# Patient Record
Sex: Female | Born: 1955
Health system: Southern US, Community
[De-identification: ages and names within clinical notes are randomized; demographics above are authoritative.]

## PROBLEM LIST (undated history)

## (undated) DIAGNOSIS — T7840XA Allergy, unspecified, initial encounter: Secondary | ICD-10-CM

## (undated) DIAGNOSIS — J449 Chronic obstructive pulmonary disease, unspecified: Secondary | ICD-10-CM

## (undated) DIAGNOSIS — I10 Essential (primary) hypertension: Secondary | ICD-10-CM

## (undated) DIAGNOSIS — E785 Hyperlipidemia, unspecified: Secondary | ICD-10-CM

## (undated) DIAGNOSIS — Z8669 Personal history of other diseases of the nervous system and sense organs: Secondary | ICD-10-CM

## (undated) DIAGNOSIS — M1990 Unspecified osteoarthritis, unspecified site: Secondary | ICD-10-CM

## (undated) DIAGNOSIS — E119 Type 2 diabetes mellitus without complications: Secondary | ICD-10-CM

## (undated) DIAGNOSIS — F32A Depression, unspecified: Secondary | ICD-10-CM

## (undated) HISTORY — DX: Chronic obstructive pulmonary disease, unspecified: J44.9

## (undated) HISTORY — PX: COLON SURGERY: SHX602

## (undated) HISTORY — DX: Hyperlipidemia, unspecified: E78.5

## (undated) HISTORY — DX: Essential (primary) hypertension: I10

## (undated) HISTORY — DX: Depression, unspecified: F32.A

## (undated) HISTORY — DX: Personal history of other diseases of the nervous system and sense organs: Z86.69

## (undated) HISTORY — DX: Type 2 diabetes mellitus without complications: E11.9

## (undated) HISTORY — DX: Unspecified osteoarthritis, unspecified site: M19.90

## (undated) HISTORY — DX: Allergy, unspecified, initial encounter: T78.40XA

---

## 2000-08-07 HISTORY — PX: BACK SURGERY: SHX140

## 2015-02-28 DIAGNOSIS — Z91030 Bee allergy status: Secondary | ICD-10-CM | POA: Insufficient documentation

## 2015-03-26 DIAGNOSIS — J300 Vasomotor rhinitis: Secondary | ICD-10-CM | POA: Insufficient documentation

## 2017-04-03 DIAGNOSIS — I152 Hypertension secondary to endocrine disorders: Secondary | ICD-10-CM | POA: Insufficient documentation

## 2017-04-03 DIAGNOSIS — F4310 Post-traumatic stress disorder, unspecified: Secondary | ICD-10-CM | POA: Insufficient documentation

## 2017-04-03 DIAGNOSIS — F329 Major depressive disorder, single episode, unspecified: Secondary | ICD-10-CM | POA: Insufficient documentation

## 2017-04-06 DIAGNOSIS — E1165 Type 2 diabetes mellitus with hyperglycemia: Secondary | ICD-10-CM | POA: Insufficient documentation

## 2017-08-10 DIAGNOSIS — G510 Bell's palsy: Secondary | ICD-10-CM | POA: Insufficient documentation

## 2019-05-17 DIAGNOSIS — E785 Hyperlipidemia, unspecified: Secondary | ICD-10-CM | POA: Insufficient documentation

## 2020-08-04 ENCOUNTER — Ambulatory Visit (INDEPENDENT_AMBULATORY_CARE_PROVIDER_SITE_OTHER): Payer: BC Managed Care – PPO | Admitting: Family Medicine

## 2020-08-04 ENCOUNTER — Other Ambulatory Visit: Payer: Self-pay

## 2020-08-04 ENCOUNTER — Encounter: Payer: Self-pay | Admitting: Family Medicine

## 2020-08-04 VITALS — BP 192/105 | HR 119 | Temp 97.6°F | Ht 63.0 in | Wt 197.4 lb

## 2020-08-04 DIAGNOSIS — N393 Stress incontinence (female) (male): Secondary | ICD-10-CM

## 2020-08-04 DIAGNOSIS — E785 Hyperlipidemia, unspecified: Secondary | ICD-10-CM

## 2020-08-04 DIAGNOSIS — L750 Bromhidrosis: Secondary | ICD-10-CM

## 2020-08-04 DIAGNOSIS — E1159 Type 2 diabetes mellitus with other circulatory complications: Secondary | ICD-10-CM

## 2020-08-04 DIAGNOSIS — R21 Rash and other nonspecific skin eruption: Secondary | ICD-10-CM

## 2020-08-04 DIAGNOSIS — I152 Hypertension secondary to endocrine disorders: Secondary | ICD-10-CM | POA: Diagnosis not present

## 2020-08-04 DIAGNOSIS — Z0000 Encounter for general adult medical examination without abnormal findings: Secondary | ICD-10-CM

## 2020-08-04 DIAGNOSIS — Z1231 Encounter for screening mammogram for malignant neoplasm of breast: Secondary | ICD-10-CM

## 2020-08-04 DIAGNOSIS — F331 Major depressive disorder, recurrent, moderate: Secondary | ICD-10-CM | POA: Diagnosis not present

## 2020-08-04 DIAGNOSIS — E119 Type 2 diabetes mellitus without complications: Secondary | ICD-10-CM

## 2020-08-04 DIAGNOSIS — E1169 Type 2 diabetes mellitus with other specified complication: Secondary | ICD-10-CM | POA: Diagnosis not present

## 2020-08-04 LAB — BAYER DCA HB A1C WAIVED: HB A1C (BAYER DCA - WAIVED): 7.6 % — ABNORMAL HIGH (ref <7.0)

## 2020-08-04 MED ORDER — TRIAMCINOLONE ACETONIDE 0.1 % EX CREA: 1.0000 "application " | TOPICAL_CREAM | Freq: Two times a day (BID) | CUTANEOUS | 0 refills | Status: DC

## 2020-08-04 MED ORDER — OXYBUTYNIN CHLORIDE ER 10 MG PO TB24: 10.0000 mg | ORAL_TABLET | Freq: Every day | ORAL | 2 refills | Status: DC

## 2020-08-04 MED ORDER — FLUOXETINE HCL 40 MG PO CAPS: 40.0000 mg | ORAL_CAPSULE | Freq: Every day | ORAL | 0 refills | Status: DC

## 2020-08-04 MED ORDER — LISINOPRIL 40 MG PO TABS: 40.0000 mg | ORAL_TABLET | Freq: Every day | ORAL | 0 refills | Status: DC

## 2020-08-04 MED ORDER — ATORVASTATIN CALCIUM 20 MG PO TABS: 20.0000 mg | ORAL_TABLET | Freq: Every day | ORAL | 0 refills | Status: DC

## 2020-08-04 NOTE — Patient Instructions (Signed)
Hypertension, Adult High blood pressure (hypertension) is when the force of blood pumping through the arteries is too strong. The arteries are the blood vessels that carry blood from the heart throughout the body. Hypertension forces the heart to work harder to pump blood and may cause arteries to become narrow or stiff. Untreated or uncontrolled hypertension can cause a heart attack, heart failure, a stroke, kidney disease, and other problems. A blood pressure reading consists of a higher number over a lower number. Ideally, your blood pressure should be below 120/80. The first ("top") number is called the systolic pressure. It is a measure of the pressure in your arteries as your heart beats. The second ("bottom") number is called the diastolic pressure. It is a measure of the pressure in your arteries as the heart relaxes. What are the causes? The exact cause of this condition is not known. There are some conditions that result in or are related to high blood pressure. What increases the risk? Some risk factors for high blood pressure are under your control. The following factors may make you more likely to develop this condition:  Smoking.  Having type 2 diabetes mellitus, high cholesterol, or both.  Not getting enough exercise or physical activity.  Being overweight.  Having too much fat, sugar, calories, or salt (sodium) in your diet.  Drinking too much alcohol. Some risk factors for high blood pressure may be difficult or impossible to change. Some of these factors include:  Having chronic kidney disease.  Having a family history of high blood pressure.  Age. Risk increases with age.  Race. You may be at higher risk if you are African American.  Gender. Men are at higher risk than women before age 45. After age 65, women are at higher risk than men.  Having obstructive sleep apnea.  Stress. What are the signs or symptoms? High blood pressure may not cause symptoms. Very high  blood pressure (hypertensive crisis) may cause:  Headache.  Anxiety.  Shortness of breath.  Nosebleed.  Nausea and vomiting.  Vision changes.  Severe chest pain.  Seizures. How is this diagnosed? This condition is diagnosed by measuring your blood pressure while you are seated, with your arm resting on a flat surface, your legs uncrossed, and your feet flat on the floor. The cuff of the blood pressure monitor will be placed directly against the skin of your upper arm at the level of your heart. It should be measured at least twice using the same arm. Certain conditions can cause a difference in blood pressure between your right and left arms. Certain factors can cause blood pressure readings to be lower or higher than normal for a short period of time:  When your blood pressure is higher when you are in a health care provider's office than when you are at home, this is called white coat hypertension. Most people with this condition do not need medicines.  When your blood pressure is higher at home than when you are in a health care provider's office, this is called masked hypertension. Most people with this condition may need medicines to control blood pressure. If you have a high blood pressure reading during one visit or you have normal blood pressure with other risk factors, you may be asked to:  Return on a different day to have your blood pressure checked again.  Monitor your blood pressure at home for 1 week or longer. If you are diagnosed with hypertension, you may have other blood or   imaging tests to help your health care provider understand your overall risk for other conditions. How is this treated? This condition is treated by making healthy lifestyle changes, such as eating healthy foods, exercising more, and reducing your alcohol intake. Your health care provider may prescribe medicine if lifestyle changes are not enough to get your blood pressure under control, and  if:  Your systolic blood pressure is above 130.  Your diastolic blood pressure is above 80. Your personal target blood pressure may vary depending on your medical conditions, your age, and other factors. Follow these instructions at home: Eating and drinking   Eat a diet that is high in fiber and potassium, and low in sodium, added sugar, and fat. An example eating plan is called the DASH (Dietary Approaches to Stop Hypertension) diet. To eat this way: ? Eat plenty of fresh fruits and vegetables. Try to fill one half of your plate at each meal with fruits and vegetables. ? Eat whole grains, such as whole-wheat pasta, brown rice, or whole-grain bread. Fill about one fourth of your plate with whole grains. ? Eat or drink low-fat dairy products, such as skim milk or low-fat yogurt. ? Avoid fatty cuts of meat, processed or cured meats, and poultry with skin. Fill about one fourth of your plate with lean proteins, such as fish, chicken without skin, beans, eggs, or tofu. ? Avoid pre-made and processed foods. These tend to be higher in sodium, added sugar, and fat.  Reduce your daily sodium intake. Most people with hypertension should eat less than 1,500 mg of sodium a day.  Do not drink alcohol if: ? Your health care provider tells you not to drink. ? You are pregnant, may be pregnant, or are planning to become pregnant.  If you drink alcohol: ? Limit how much you use to:  0-1 drink a day for women.  0-2 drinks a day for men. ? Be aware of how much alcohol is in your drink. In the [place] one drink equals one 12 oz bottle of beer (355 mL), one 5 oz glass of wine (148 mL), or one 1 oz glass of hard liquor (44 mL). Lifestyle   Work with your health care provider to maintain a healthy body weight or to lose weight. Ask what an ideal weight is for you.  Get at least 30 minutes of exercise most days of the week. Activities may include walking, swimming, or biking.  Include exercise to  strengthen your muscles (resistance exercise), such as Pilates or lifting weights, as part of your weekly exercise routine. Try to do these types of exercises for 30 minutes at least 3 days a week.  Do not use any products that contain nicotine or tobacco, such as cigarettes, e-cigarettes, and chewing tobacco. If you need help quitting, ask your health care provider.  Monitor your blood pressure at home as told by your health care provider.  Keep all follow-up visits as told by your health care provider. This is important. Medicines  Take over-the-counter and prescription medicines only as told by your health care provider. Follow directions carefully. Blood pressure medicines must be taken as prescribed.  Do not skip doses of blood pressure medicine. Doing this puts you at risk for problems and can make the medicine less effective.  Ask your health care provider about side effects or reactions to medicines that you should watch for. Contact a health care provider if you:  Think you are having a reaction to a medicine you   are taking.  Have headaches that keep coming back (recurring).  Feel dizzy.  Have swelling in your ankles.  Have trouble with your vision. Get help right away if you:  Develop a severe headache or confusion.  Have unusual weakness or numbness.  Feel faint.  Have severe pain in your chest or abdomen.  Vomit repeatedly.  Have trouble breathing. Summary  Hypertension is when the force of blood pumping through your arteries is too strong. If this condition is not controlled, it may put you at risk for serious complications.  Your personal target blood pressure may vary depending on your medical conditions, your age, and other factors. For most people, a normal blood pressure is less than 120/80.  Hypertension is treated with lifestyle changes, medicines, or a combination of both. Lifestyle changes include losing weight, eating a healthy, low-sodium diet,  exercising more, and limiting alcohol. This information is not intended to replace advice given to you by your health care provider. Make sure you discuss any questions you have with your health care provider. Document Revised: 04/03/2018 Document Reviewed: 04/03/2018 Elsevier Patient Education  2020 [employer]. Diabetes Mellitus and Nutrition, Adult When you have diabetes (diabetes mellitus), it is very important to have healthy eating habits because your blood sugar (glucose) levels are greatly affected by what you eat and drink. Eating healthy foods in the appropriate amounts, at about the same times every day, can help you:  Control your blood glucose.  Lower your risk of heart disease.  Improve your blood pressure.  Reach or maintain a healthy weight. Every person with diabetes is different, and each person has different needs for a meal plan. Your health care provider may recommend that you work with a diet and nutrition specialist (dietitian) to make a meal plan that is best for you. Your meal plan may vary depending on factors such as:  The calories you need.  The medicines you take.  Your weight.  Your blood glucose, blood pressure, and cholesterol levels.  Your activity level.  Other health conditions you have, such as heart or kidney disease. How do carbohydrates affect me? Carbohydrates, also called carbs, affect your blood glucose level more than any other type of food. Eating carbs naturally raises the amount of glucose in your blood. Carb counting is a method for keeping track of how many carbs you eat. Counting carbs is important to keep your blood glucose at a healthy level, especially if you use insulin or take certain oral diabetes medicines. It is important to know how many carbs you can safely have in each meal. This is different for every person. Your dietitian can help you calculate how many carbs you should have at each meal and for each snack. Foods that  contain carbs include:  Bread, cereal, rice, pasta, and crackers.  Potatoes and corn.  Peas, beans, and lentils.  Milk and yogurt.  Fruit and juice.  Desserts, such as cakes, cookies, ice cream, and candy. How does alcohol affect me? Alcohol can cause a sudden decrease in blood glucose (hypoglycemia), especially if you use insulin or take certain oral diabetes medicines. Hypoglycemia can be a life-threatening condition. Symptoms of hypoglycemia (sleepiness, dizziness, and confusion) are similar to symptoms of having too much alcohol. If your health care provider says that alcohol is safe for you, follow these guidelines:  Limit alcohol intake to no more than 1 drink per day for nonpregnant women and 2 drinks per day for men. One drink equals 12 oz  of beer, 5 oz of wine, or 1 oz of hard liquor.  Do not drink on an empty stomach.  Keep yourself hydrated with water, diet soda, or unsweetened iced tea.  Keep in mind that regular soda, juice, and other mixers may contain a lot of sugar and must be counted as carbs. What are tips for following this plan?  Reading food labels  Start by checking the serving size on the "Nutrition Facts" label of packaged foods and drinks. The amount of calories, carbs, fats, and other nutrients listed on the label is based on one serving of the item. Many items contain more than one serving per package.  Check the total grams (g) of carbs in one serving. You can calculate the number of servings of carbs in one serving by dividing the total carbs by 15. For example, if a food has 30 g of total carbs, it would be equal to 2 servings of carbs.  Check the number of grams (g) of saturated and trans fats in one serving. Choose foods that have low or no amount of these fats.  Check the number of milligrams (mg) of salt (sodium) in one serving. Most people should limit total sodium intake to less than 2,300 mg per day.  Always check the nutrition information of  foods labeled as "low-fat" or "nonfat". These foods may be higher in added sugar or refined carbs and should be avoided.  Talk to your dietitian to identify your daily goals for nutrients listed on the label. Shopping  Avoid buying canned, premade, or processed foods. These foods tend to be high in fat, sodium, and added sugar.  Shop around the outside edge of the grocery store. This includes fresh fruits and vegetables, bulk grains, fresh meats, and fresh dairy. Cooking  Use low-heat cooking methods, such as baking, instead of high-heat cooking methods like deep frying.  Cook using healthy oils, such as olive, canola, or sunflower oil.  Avoid cooking with butter, cream, or high-fat meats. Meal planning  Eat meals and snacks regularly, preferably at the same times every day. Avoid going long periods of time without eating.  Eat foods high in fiber, such as fresh fruits, vegetables, beans, and whole grains. Talk to your dietitian about how many servings of carbs you can eat at each meal.  Eat 4-6 ounces (oz) of lean protein each day, such as lean meat, chicken, fish, eggs, or tofu. One oz of lean protein is equal to: ? 1 oz of meat, chicken, or fish. ? 1 egg. ?  cup of tofu.  Eat some foods each day that contain healthy fats, such as avocado, nuts, seeds, and fish. Lifestyle  Check your blood glucose regularly.  Exercise regularly as told by your health care provider. This may include: ? 150 minutes of moderate-intensity or vigorous-intensity exercise each week. This could be brisk walking, biking, or water aerobics. ? Stretching and doing strength exercises, such as yoga or weightlifting, at least 2 times a week.  Take medicines as told by your health care provider.  Do not use any products that contain nicotine or tobacco, such as cigarettes and e-cigarettes. If you need help quitting, ask your health care provider.  Work with a Social worker or diabetes educator to identify  strategies to manage stress and any emotional and social challenges. Questions to ask a health care provider  Do I need to meet with a diabetes educator?  Do I need to meet with a dietitian?  What number can  I call if I have questions?  When are the best times to check my blood glucose? Where to find more information:  American Diabetes Association: diabetes.org  Academy of Nutrition and Dietetics: www.eatright.[employer] of Diabetes and Digestive and Kidney Diseases (NIH): [employer] Summary  A healthy meal plan will help you control your blood glucose and maintain a healthy lifestyle.  Working with a diet and nutrition specialist (dietitian) can help you make a meal plan that is best for you.  Keep in mind that carbohydrates (carbs) and alcohol have immediate effects on your blood glucose levels. It is important to count carbs and to use alcohol carefully. This information is not intended to replace advice given to you by your health care provider. Make sure you discuss any questions you have with your health care provider. Document Revised: 07/06/2017 Document Reviewed: 08/28/2016 Elsevier Patient Education  2020 [employer]. Diabetes Mellitus and Exercise Exercising regularly is important for your overall health, especially when you have diabetes (diabetes mellitus). Exercising is not only about losing weight. It has many other health benefits, such as increasing muscle strength and bone density and reducing body fat and stress. This leads to improved fitness, flexibility, and endurance, all of which result in better overall health. Exercise has additional benefits for people with diabetes, including:  Reducing appetite.  Helping to lower and control blood glucose.  Lowering blood pressure.  Helping to control amounts of fatty substances (lipids) in the blood, such as cholesterol and triglycerides.  Helping the body to respond better to insulin  (improving insulin sensitivity).  Reducing how much insulin the body needs.  Decreasing the risk for heart disease by: ? Lowering cholesterol and triglyceride levels. ? Increasing the levels of good cholesterol. ? Lowering blood glucose levels. What is my activity plan? Your health care provider or certified diabetes educator can help you make a plan for the type and frequency of exercise (activity plan) that works for you. Make sure that you:  Do at least 150 minutes of moderate-intensity or vigorous-intensity exercise each week. This could be brisk walking, biking, or water aerobics. ? Do stretching and strength exercises, such as yoga or weightlifting, at least 2 times a week. ? Spread out your activity over at least 3 days of the week.  Get some form of physical activity every day. ? Do not go more than 2 days in a row without some kind of physical activity. ? Avoid being inactive for more than 30 minutes at a time. Take frequent breaks to walk or stretch.  Choose a type of exercise or activity that you enjoy, and set realistic goals.  Start slowly, and gradually increase the intensity of your exercise over time. What do I need to know about managing my diabetes?   Check your blood glucose before and after exercising. ? If your blood glucose is 240 mg/dL (13.3 mmol/L) or higher before you exercise, check your urine for ketones. If you have ketones in your urine, do not exercise until your blood glucose returns to normal. ? If your blood glucose is 100 mg/dL (5.6 mmol/L) or lower, eat a snack containing 15-20 grams of carbohydrate. Check your blood glucose 15 minutes after the snack to make sure that your level is above 100 mg/dL (5.6 mmol/L) before you start your exercise.  Know the symptoms of low blood glucose (hypoglycemia) and how to treat it. Your risk for hypoglycemia increases during and after exercise. Common symptoms of hypoglycemia can  include: ?  Hunger. ? Anxiety. ? Sweating and feeling clammy. ? Confusion. ? Dizziness or feeling light-headed. ? Increased heart rate or palpitations. ? Blurry vision. ? Tingling or numbness around the mouth, lips, or tongue. ? Tremors or shakes. ? Irritability.  Keep a rapid-acting carbohydrate snack available before, during, and after exercise to help prevent or treat hypoglycemia.  Avoid injecting insulin into areas of the body that are going to be exercised. For example, avoid injecting insulin into: ? The arms, when playing tennis. ? The legs, when jogging.  Keep records of your exercise habits. Doing this can help you and your health care provider adjust your diabetes management plan as needed. Write down: ? Food that you eat before and after you exercise. ? Blood glucose levels before and after you exercise. ? The type and amount of exercise you have done. ? When your insulin is expected to peak, if you use insulin. Avoid exercising at times when your insulin is peaking.  When you start a new exercise or activity, work with your health care provider to make sure the activity is safe for you, and to adjust your insulin, medicines, or food intake as needed.  Drink plenty of water while you exercise to prevent dehydration or heat stroke. Drink enough fluid to keep your urine clear or pale yellow. Summary  Exercising regularly is important for your overall health, especially when you have diabetes (diabetes mellitus).  Exercising has many health benefits, such as increasing muscle strength and bone density and reducing body fat and stress.  Your health care provider or certified diabetes educator can help you make a plan for the type and frequency of exercise (activity plan) that works for you.  When you start a new exercise or activity, work with your health care provider to make sure the activity is safe for you, and to adjust your insulin, medicines, or food intake as  needed. This information is not intended to replace advice given to you by your health care provider. Make sure you discuss any questions you have with your health care provider. Document Revised: 02/15/2017 Document Reviewed: 01/03/2016 Elsevier Patient Education  2020 [employer].

## 2020-08-04 NOTE — Progress Notes (Signed)
New Patient Office Visit  Subjective:  Patient ID: Kelsey Porter, female    DOB: August 23, 1955  Age: 64 y.o. MRN: [id number]  CC:  Chief Complaint  Patient presents with  . New Patient (Initial Visit)    HPI Kelsey Porter presents to establish care. She has not had insurance for awhile and has been out most of her medications for a few months.   She has been still taking her metformin and glipizide. She checks her blood sugars occasionally and they are usually 130-140s. She does reports low blood sugars at times where be sugars drop to the sixties. This has happened 5x in the last 2 months. Her hypoglycemia resolves with food. She denies numbness or tingling in her feet.   She has been out of her BP medication. She denies chest pain, shortness of breath, blurred vision, edema, dizziness or palpitations.   She reports urine odor. Denies dysuria, abdominal pain, flank pain, fever, or vaginal discharge. She reports stress incontinence. She gets frequent rashes in her perineum which is resolved with kenalog cream.   She had lunch about 4 hours ago and only had water and black coffee since.   Depression screen PHQ 2/9 08/04/2020  Decreased Interest 1  Down, Depressed, Hopeless 3  PHQ - 2 Score 4  Altered sleeping 2  Tired, decreased energy 1  Change in appetite 0  Feeling bad or failure about yourself  0  Trouble concentrating 0  Moving slowly or fidgety/restless 0  Suicidal thoughts 0  PHQ-9 Score 7  Difficult doing work/chores Very difficult     Past Medical History:  Diagnosis Date  . Allergy   . Arthritis   . Depression   . Diabetes mellitus without complication ([place])   . History of Bell's palsy   . Hyperlipidemia   . Hypertension     Past Surgical History:  Procedure Laterality Date  . BACK SURGERY  2002  . COLON SURGERY     had part of her colon removed    Family History  Problem Relation Age of Onset  . Alcohol abuse Mother   . Arthritis Mother    . COPD Mother   . Diabetes Mother   . Heart disease Mother   . Hyperlipidemia Mother   . Hypertension Mother   . Kidney disease Mother   . Cancer Father        lung  . Diabetes Sister   . Arthritis Maternal Grandmother   . Diabetes Maternal Grandmother   . Stroke Maternal Grandmother   . Anxiety disorder Brother   . Diabetes Brother     Social History   Socioeconomic History  . Marital status: Married    Spouse name: Kelsey Porter  . Number of children: 2  . Years of education: 76  . Highest education level: Associate degree: occupational, Hotel manager, or vocational program  Occupational History  . Occupation: Retired  Tobacco Use  . Smoking status: Current Every Day Smoker    Packs/day: 1.00    Years: 40.00    Pack years: 40.00    Types: Cigarettes  . Smokeless tobacco: Never Used  Vaping Use  . Vaping Use: Never used  Substance and Sexual Activity  . Alcohol use: Never  . Drug use: Never  . Sexual activity: Yes    Birth control/protection: Post-menopausal  Other Topics Concern  . Not on file  Social History Narrative  . Not on file   Social Determinants of Health   Financial Resource Strain: Not on  file  Food Insecurity: Not on file  Transportation Needs: Not on file  Physical Activity: Not on file  Stress: Not on file  Social Connections: Not on file  Intimate Partner Violence: Not on file    ROS Review of Systems Negative unless specially indicated above in HPI.  Objective:   Today's Vitals: BP (!) 183/97   Pulse (!) 119   Temp 97.6 F (36.4 C) (Temporal)   Ht $R'5\' 3"'GC$  (1.6 m)   Wt 197 lb 6 oz (89.5 kg)   BMI 34.96 kg/m   Physical Exam Vitals and nursing note reviewed.  Constitutional:      General: She is not in acute distress.    Appearance: Normal appearance. She is not ill-appearing, toxic-appearing or diaphoretic.  HENT:     Mouth/Throat:     Mouth: Mucous membranes are moist.     Pharynx: Oropharynx is clear.  Eyes:      Conjunctiva/sclera: Conjunctivae normal.     Pupils: Pupils are equal, round, and reactive to light.  Cardiovascular:     Rate and Rhythm: Normal rate and regular rhythm.     Heart sounds: Normal heart sounds. No murmur heard.   Pulmonary:     Effort: Pulmonary effort is normal. No respiratory distress.     Breath sounds: Normal breath sounds.  Musculoskeletal:     Right lower leg: No edema.     Left lower leg: No edema.  Skin:    General: Skin is warm and dry.  Neurological:     General: No focal deficit present.     Mental Status: She is alert and oriented to person, place, and time.  Psychiatric:        Mood and Affect: Mood normal.        Behavior: Behavior normal.        Thought Content: Thought content normal.        Judgment: Judgment normal.     Assessment & Plan:   Yari was seen today for new patient (initial visit).  Diagnoses and all orders for this visit:  Type 2 diabetes mellitus without complication, without long-term current use of insulin (HCC) A1c pending. Continue metformin. Discussed stopping glipizide due to hypoglycemia and starting Ozempic if needed.  -     CBC with Differential/Platelet -     CMP14+EGFR -     Lipid panel -     Bayer DCA Hb A1c Waived -     Microalbumin / creatinine urine ratio  Hyperlipidemia associated with type 2 diabetes mellitus ([place]) Labs pending as below. Has been out of medication for months.  -     Lipid panel -     atorvastatin (LIPITOR) 20 MG tablet; Take 1 tablet (20 mg total) by mouth at bedtime.  Hypertension associated with diabetes ([place]) Uncontrolled, has been out of medication for months. No symptoms today. Restart lisinopril. Discussed starting with 20 mg for a few days then increasing to 40 mg daily. Check BP daily at home. Discussed goal BP of 130/80.  -     CBC with Differential/Platelet -     CMP14+EGFR -     Lipid panel -     TSH -     lisinopril (ZESTRIL) 40 MG tablet; Take 1 tablet (40 mg total) by  mouth daily.  Moderate episode of recurrent major depressive disorder (HCC) PHQ 9 score of 7 today. Restart prozac. Start at 20 mg daily for 1 week, then increase to 40 mg daily if  tolerating.  -     FLUoxetine (PROZAC) 40 MG capsule; Take 1 capsule (40 mg total) by mouth daily.  Rash -     triamcinolone (KENALOG) 0.1 %; Apply 1 application topically 2 (two) times daily.  Stress incontinence -     oxybutynin (DITROPAN XL) 10 MG 24 hr tablet; Take 1 tablet (10 mg total) by mouth at bedtime.  Urinary body odor UA pending.  -     Urinalysis, Routine w reflex microscopic  Encounter for screening mammogram for malignant neoplasm of breast -     MM 3D SCREEN BREAST BILATERAL; Future  Encounter for medical examination to establish care Will discuss colon cancer screening at next visit.    Follow-up: Follow up with Almyra Free in 2 weeks for BP check and discuss DM medications.   The patient indicates understanding of these issues and agrees with the plan.   Gwenlyn Perking, FNP

## 2020-08-05 ENCOUNTER — Other Ambulatory Visit: Payer: Self-pay | Admitting: Family Medicine

## 2020-08-05 DIAGNOSIS — B379 Candidiasis, unspecified: Secondary | ICD-10-CM

## 2020-08-05 DIAGNOSIS — E119 Type 2 diabetes mellitus without complications: Secondary | ICD-10-CM

## 2020-08-05 LAB — CMP14+EGFR
ALT: 17 IU/L (ref 0–32)
AST: 15 IU/L (ref 0–40)
Albumin/Globulin Ratio: 1.5 (ref 1.2–2.2)
Albumin: 4.3 g/dL (ref 3.8–4.8)
Alkaline Phosphatase: 92 IU/L (ref 44–121)
BUN/Creatinine Ratio: 20 (ref 12–28)
BUN: 16 mg/dL (ref 8–27)
Bilirubin Total: <0.2 mg/dL (ref 0.0–1.2)
CO2: 23 mmol/L (ref 20–29)
Calcium: 10.3 mg/dL (ref 8.7–10.3)
Chloride: 100 mmol/L (ref 96–106)
Creatinine, Ser: 0.81 mg/dL (ref 0.57–1.00)
GFR calc Af Amer: 89 mL/min/{1.73_m2} (ref >59)
GFR calc non Af Amer: 77 mL/min/{1.73_m2} (ref >59)
Globulin, Total: 2.8 g/dL (ref 1.5–4.5)
Glucose: 89 mg/dL (ref 65–99)
Potassium: 4.5 mmol/L (ref 3.5–5.2)
Sodium: 140 mmol/L (ref 134–144)
Total Protein: 7.1 g/dL (ref 6.0–8.5)

## 2020-08-05 LAB — MICROSCOPIC EXAMINATION
Casts: NONE SEEN /lpf
RBC, Urine: NONE SEEN /hpf (ref 0–2)

## 2020-08-05 LAB — URINALYSIS, ROUTINE W REFLEX MICROSCOPIC
Bilirubin, UA: NEGATIVE
Glucose, UA: NEGATIVE
Ketones, UA: NEGATIVE
Leukocytes,UA: NEGATIVE
Nitrite, UA: NEGATIVE
RBC, UA: NEGATIVE
Specific Gravity, UA: 1.023 (ref 1.005–1.030)
Urobilinogen, Ur: 0.2 mg/dL (ref 0.2–1.0)
pH, UA: 6 (ref 5.0–7.5)

## 2020-08-05 LAB — CBC WITH DIFFERENTIAL/PLATELET
Basophils Absolute: 0.1 10*3/uL (ref 0.0–0.2)
Basos: 1 %
EOS (ABSOLUTE): 0.2 10*3/uL (ref 0.0–0.4)
Eos: 2 %
Hematocrit: 46.4 % (ref 34.0–46.6)
Hemoglobin: 15.5 g/dL (ref 11.1–15.9)
Immature Grans (Abs): 0.1 10*3/uL (ref 0.0–0.1)
Immature Granulocytes: 1 %
Lymphocytes Absolute: 2.5 10*3/uL (ref 0.7–3.1)
Lymphs: 24 %
MCH: 29.9 pg (ref 26.6–33.0)
MCHC: 33.4 g/dL (ref 31.5–35.7)
MCV: 89 fL (ref 79–97)
Monocytes Absolute: 0.8 10*3/uL (ref 0.1–0.9)
Monocytes: 8 %
Neutrophils Absolute: 6.7 10*3/uL (ref 1.4–7.0)
Neutrophils: 64 %
Platelets: 383 10*3/uL (ref 150–450)
RBC: 5.19 x10E6/uL (ref 3.77–5.28)
RDW: 12.3 % (ref 11.7–15.4)
WBC: 10.4 10*3/uL (ref 3.4–10.8)

## 2020-08-05 LAB — LIPID PANEL
Chol/HDL Ratio: 4.4 ratio (ref 0.0–4.4)
Cholesterol, Total: 330 mg/dL — ABNORMAL HIGH (ref 100–199)
HDL: 75 mg/dL (ref >39)
LDL Chol Calc (NIH): 177 mg/dL — ABNORMAL HIGH (ref 0–99)
Triglycerides: 398 mg/dL — ABNORMAL HIGH (ref 0–149)
VLDL Cholesterol Cal: 78 mg/dL — ABNORMAL HIGH (ref 5–40)

## 2020-08-05 LAB — TSH: TSH: 2.25 u[IU]/mL (ref 0.450–4.500)

## 2020-08-05 LAB — MICROALBUMIN / CREATININE URINE RATIO
Creatinine, Urine: 85.9 mg/dL
Microalb/Creat Ratio: 2105 mg/g creat — ABNORMAL HIGH (ref 0–29)
Microalbumin, Urine: 1807.8 ug/mL

## 2020-08-05 MED ORDER — FLUCONAZOLE 150 MG PO TABS: 150.0000 mg | ORAL_TABLET | Freq: Once | ORAL | 0 refills | Status: AC

## 2020-08-05 MED ORDER — OZEMPIC (0.25 OR 0.5 MG/DOSE) 2 MG/1.5ML ~~LOC~~ SOPN
0.5000 mg | PEN_INJECTOR | SUBCUTANEOUS | 3 refills | Status: DC
Start: 2020-08-05 — End: 2020-11-01

## 2020-08-10 ENCOUNTER — Telehealth: Payer: Self-pay

## 2020-08-10 ENCOUNTER — Other Ambulatory Visit: Payer: Self-pay | Admitting: Family Medicine

## 2020-08-10 MED ORDER — NIFEDIPINE ER OSMOTIC RELEASE 30 MG PO TB24: 30.0000 mg | ORAL_TABLET | Freq: Every day | ORAL | 1 refills | Status: DC

## 2020-08-10 NOTE — Telephone Encounter (Signed)
Patient aware and verbalizes understanding. Has up coming appointment and does not wish to move it sooner.

## 2020-08-10 NOTE — Telephone Encounter (Signed)
Patient was seen to establish care with Tiffany on 08/04/2020.  She has a past history of hypertension and had been out of her medications for months.  Her blood pressure at initial visit was elevated also.  Tiffany started patient on Lisinopril 40 mg daily and patient has take as directed.  Her blood pressure at home has been elevated since her visit.  196/147 earlier today and 122/102 at recheck.  She has a 2 week follow up with Raynelle Fanning on 08/19/20 to recheck blood pressure and discuss diabetes.  Patient is concerned about blood pressure being that high and would like to know if she needs to add another medication before her visit with Raynelle Fanning.  Uses [facility] [place].  Please advise.

## 2020-08-10 NOTE — Telephone Encounter (Signed)
I sent in nifedipiine for her. Can yo get her in with Tiffany later this week?

## 2020-08-19 ENCOUNTER — Other Ambulatory Visit: Payer: Self-pay

## 2020-08-19 ENCOUNTER — Encounter: Payer: Self-pay | Admitting: Pharmacist

## 2020-08-19 ENCOUNTER — Ambulatory Visit (INDEPENDENT_AMBULATORY_CARE_PROVIDER_SITE_OTHER): Payer: BC Managed Care – PPO | Admitting: Pharmacist

## 2020-08-19 VITALS — BP 174/93

## 2020-08-19 DIAGNOSIS — E119 Type 2 diabetes mellitus without complications: Secondary | ICD-10-CM

## 2020-08-19 MED ORDER — HYDROCHLOROTHIAZIDE 25 MG PO TABS: 25.0000 mg | ORAL_TABLET | Freq: Every day | ORAL | 3 refills | Status: DC

## 2020-08-19 NOTE — Progress Notes (Signed)
    08/19/2020 Name: Kelsey Porter MRN: [id number] DOB: March 04, 1956   S:  75 yoF Presents for diabetes evaluation, education, and management Patient was referred and last seen by Primary Care Provider on 08/04/20.  Insurance coverage/medication affordability: BCBS commercial  Patient reports adherence with medications. . Current diabetes medications include: metformin, ozempic . Current hypertension medications include: lisinopril, nifedipine Goal 130/80 . Current hyperlipidemia medications include: atorvastatin   Patient denies hypoglycemic events.    O:  Lab Results  Component Value Date   HGBA1C 7.6 (H) 08/04/2020    Vitals:   08/19/20 0854  BP: (!) 174/93       Lipid Panel     Component Value Date/Time   CHOL 330 (H) 08/04/2020 1548   TRIG 398 (H) 08/04/2020 1548   HDL 75 08/04/2020 1548   CHOLHDL 4.4 08/04/2020 1548   LDLCALC 177 (H) 08/04/2020 1548    BP readings: 12/31196/118 1/1  209/119 1/2 159/99 1/3  159/105 1/4  196/147 1/5  174/117 1/6  112/102 1/7  156/113 1/8  163/98 1/9   154/106 1/10 163/1010 1/11 153/103 1/12 136/96 1/13 156/94 1/14  154/94 Today 174/93   A/P:  Diabetes T2DM currently uncontrolled, however patient is improving. Patient is dherent with medication.  -Continue Ozempic 0.5mg  sq weekly  -Patient has discontinued glipizde  -Continue metformin  -Extensively discussed pathophysiology of diabetes, recommended lifestyle interventions, dietary effects on blood sugar control  -Counseled on s/sx of and management of hypoglycemia  -Next A1C anticipated 3 months.  Hypertension -Start HCTZ 25mg  daily  -continue lisinopril 40mg  daily  -discontinue nifedipine; patient does not wish to take this medication due to side effects  Written patient instructions provided.  Total time in face to face counseling 25 minutes.   Follow up PCP Clinic Visit in 1 month for BP check.    Regina Eck, PharmD,  BCPS Clinical Pharmacist, [place]  II Phone [phone]

## 2020-09-01 ENCOUNTER — Ambulatory Visit
Admission: RE | Admit: 2020-09-01 | Discharge: 2020-09-01 | Disposition: A | Discharge status: Home / Self Care | Payer: BC Managed Care – PPO | Source: Ambulatory Visit | Attending: Family Medicine | Admitting: Family Medicine

## 2020-09-01 ENCOUNTER — Other Ambulatory Visit: Payer: Self-pay

## 2020-09-01 DIAGNOSIS — Z1231 Encounter for screening mammogram for malignant neoplasm of breast: Secondary | ICD-10-CM

## 2020-09-20 ENCOUNTER — Encounter: Payer: Self-pay | Admitting: Family Medicine

## 2020-09-20 ENCOUNTER — Ambulatory Visit (INDEPENDENT_AMBULATORY_CARE_PROVIDER_SITE_OTHER): Payer: BC Managed Care – PPO

## 2020-09-20 ENCOUNTER — Other Ambulatory Visit: Payer: Self-pay

## 2020-09-20 ENCOUNTER — Ambulatory Visit (INDEPENDENT_AMBULATORY_CARE_PROVIDER_SITE_OTHER): Payer: BC Managed Care – PPO | Admitting: Family Medicine

## 2020-09-20 VITALS — BP 154/81 | HR 101 | Temp 98.3°F | Ht 63.0 in | Wt 185.0 lb

## 2020-09-20 DIAGNOSIS — I10 Essential (primary) hypertension: Secondary | ICD-10-CM | POA: Diagnosis not present

## 2020-09-20 DIAGNOSIS — R195 Other fecal abnormalities: Secondary | ICD-10-CM

## 2020-09-20 DIAGNOSIS — R42 Dizziness and giddiness: Secondary | ICD-10-CM

## 2020-09-20 DIAGNOSIS — Z1211 Encounter for screening for malignant neoplasm of colon: Secondary | ICD-10-CM | POA: Diagnosis not present

## 2020-09-20 DIAGNOSIS — K5900 Constipation, unspecified: Secondary | ICD-10-CM | POA: Diagnosis not present

## 2020-09-20 MED ORDER — MECLIZINE HCL 25 MG PO TABS: 25.0000 mg | ORAL_TABLET | Freq: Three times a day (TID) | ORAL | 0 refills | Status: DC | PRN

## 2020-09-20 MED ORDER — HYDROCHLOROTHIAZIDE 50 MG PO TABS: 50.0000 mg | ORAL_TABLET | Freq: Every day | ORAL | 3 refills | Status: DC

## 2020-09-20 NOTE — Progress Notes (Signed)
Established Patient Office Visit  Subjective:  Patient ID: Kelsey Porter, Kelsey    DOB: November 23, 1955  Age: 65 y.o. MRN: [id number]  CC:  Chief Complaint  Patient presents with  . Hypertension    1 month follow up     HPI Kelsey Porter presents for HTN follow up. She has one new concern today.  1. HTN Complaint with meds - Yes Current Medications - hydrochlorothiazide 25 mg, lisinopril 40 mg  Checking BP at home ranging: 962I systolic Watching Salt intake - Yes Pertinent ROS:  Visual Disturbances - No Chest pain - No Dyspnea - No Palpitations - No LE edema - No She does report some dizziness with head movement, usually when looking up.  They report good compliance with medications and can restate their regimen by memory. No medication side effects.  2. Penciling of stools Kelsey Porter report pencil like stools for about a week. She also has had some nausea and one episode of vomiting. She denies bleeding or abdominal pain. She has a daily bowel movement, but it a small amount and is difficult to pass. She had changed her diet recently to a healthier diet and has started a probiotic and fiber supplement. She has not had a colonoscopy in about 15 years. She had a colonectomy about years ago due to an abscess. Denies fever or unintended weight loss.   BP Readings from Last 3 Encounters:  09/20/20 (!) 154/81  08/19/20 (!) 174/93  08/04/20 (!) 192/105   CMP Latest Ref Rng & Units 08/04/2020  Glucose 65 - 99 mg/dL 89  BUN 8 - 27 mg/dL 16  Creatinine 0.57 - 1.00 mg/dL 0.81  Sodium 134 - 144 mmol/L 140  Potassium 3.5 - 5.2 mmol/L 4.5  Chloride 96 - 106 mmol/L 100  CO2 20 - 29 mmol/L 23  Calcium 8.7 - 10.3 mg/dL 10.3  Total Protein 6.0 - 8.5 g/dL 7.1  Total Bilirubin 0.0 - 1.2 mg/dL <0.2  Alkaline Phos 44 - 121 IU/L 92  AST 0 - 40 IU/L 15  ALT 0 - 32 IU/L 17      Past Medical History:  Diagnosis Date  . Allergy   . Arthritis   . Depression   . Diabetes mellitus  without complication ([place])   . History of Bell's palsy   . Hyperlipidemia   . Hypertension     Past Surgical History:  Procedure Laterality Date  . BACK SURGERY  2002  . COLON SURGERY     had part of her colon removed    Family History  Problem Relation Age of Onset  . Alcohol abuse Mother   . Arthritis Mother   . COPD Mother   . Diabetes Mother   . Heart disease Mother   . Hyperlipidemia Mother   . Hypertension Mother   . Kidney disease Mother   . Cancer Father        lung  . Diabetes Sister   . Arthritis Maternal Grandmother   . Diabetes Maternal Grandmother   . Stroke Maternal Grandmother   . Anxiety disorder Brother   . Diabetes Brother     Social History   Socioeconomic History  . Marital status: Married    Spouse name: Kelsey Porter  . Number of children: 2  . Years of education: 58  . Highest education level: Associate degree: occupational, Hotel manager, or vocational program  Occupational History  . Occupation: Retired  Tobacco Use  . Smoking status: Current Every Day Smoker    Packs/day:  1.00    Years: 40.00    Pack years: 40.00    Types: Cigarettes  . Smokeless tobacco: Never Used  Vaping Use  . Vaping Use: Never used  Substance and Sexual Activity  . Alcohol use: Never  . Drug use: Never  . Sexual activity: Yes    Birth control/protection: Post-menopausal  Other Topics Concern  . Not on file  Social History Narrative  . Not on file   Social Determinants of Health   Financial Resource Strain: Not on file  Food Insecurity: Not on file  Transportation Needs: Not on file  Physical Activity: Not on file  Stress: Not on file  Social Connections: Not on file  Intimate Partner Violence: Not on file    Outpatient Medications Prior to Visit  Medication Sig Dispense Refill  . atorvastatin (LIPITOR) 20 MG tablet Take 1 tablet (20 mg total) by mouth at bedtime. 90 tablet 0  . FLUoxetine (PROZAC) 40 MG capsule Take 1 capsule (40 mg total) by mouth  daily. 90 capsule 0  . hydrochlorothiazide (HYDRODIURIL) 25 MG tablet Take 1 tablet (25 mg total) by mouth daily. 90 tablet 3  . lisinopril (ZESTRIL) 40 MG tablet Take 1 tablet (40 mg total) by mouth daily. 90 tablet 0  . Melatonin 10 MG TABS Take by mouth.    . metFORMIN (GLUCOPHAGE) 1000 MG tablet Take 1,000 mg by mouth 2 (two) times daily.    Marland Kitchen oxybutynin (DITROPAN XL) 10 MG 24 hr tablet Take 1 tablet (10 mg total) by mouth at bedtime. 90 tablet 2  . Semaglutide,0.25 or 0.5MG /DOS, (OZEMPIC, 0.25 OR 0.5 MG/DOSE,) 2 MG/1.5ML SOPN Inject 0.5 mg into the skin once a week. Inject 0.25 mg into the skin once a week for 4 weeks. Then, inject 0.5 mg into the skin once a week. 1.5 mL 3  . triamcinolone (KENALOG) 0.1 % Apply 1 application topically 2 (two) times daily. 30 g 0  . TURMERIC PO Take by mouth.     No facility-administered medications prior to visit.    Allergies  Allergen Reactions  . Bee Venom Anaphylaxis  . Penicillin G Rash    ROS Review of Systems As per HPI.    Objective:    Physical Exam Vitals and nursing note reviewed.  Constitutional:      General: She is not in acute distress.    Appearance: Normal appearance. She is not ill-appearing, toxic-appearing or diaphoretic.  HENT:     Head: Normocephalic and atraumatic.  Cardiovascular:     Rate and Rhythm: Normal rate and regular rhythm.     Heart sounds: Normal heart sounds. No murmur heard.   Pulmonary:     Effort: Pulmonary effort is normal. No respiratory distress.     Breath sounds: Normal breath sounds.  Abdominal:     General: Bowel sounds are normal. There is no distension.     Palpations: Abdomen is soft. There is no shifting dullness, fluid wave or mass.     Tenderness: There is no abdominal tenderness. There is no guarding or rebound. Negative signs include Murphy's sign, McBurney's sign and obturator sign.     Hernia: No hernia is present.  Musculoskeletal:     Right lower leg: No edema.     Left  lower leg: No edema.  Skin:    General: Skin is warm and dry.     Capillary Refill: Capillary refill takes less than 2 seconds.  Neurological:     General: No focal deficit  present.     Mental Status: She is alert and oriented to person, place, and time.    BP (!) 154/81   Pulse (!) 101   Temp 98.3 F (36.8 C) (Temporal)   Ht 5\' 3"  (1.6 m)   Wt 185 lb (83.9 kg)   SpO2 94%   BMI 32.77 kg/m  Wt Readings from Last 3 Encounters:  09/20/20 185 lb (83.9 kg)  08/04/20 197 lb 6 oz (89.5 kg)     Health Maintenance Due  Topic Date Due  . FOOT EXAM  Never done  . OPHTHALMOLOGY EXAM  Never done  . COLONOSCOPY (Pts 45-33yrs Insurance coverage will need to be confirmed)  Never done    There are no preventive care reminders to display for this patient.  Lab Results  Component Value Date   TSH 2.250 08/04/2020   Lab Results  Component Value Date   WBC 10.4 08/04/2020   HGB 15.5 08/04/2020   HCT 46.4 08/04/2020   MCV 89 08/04/2020   PLT 383 08/04/2020   Lab Results  Component Value Date   NA 140 08/04/2020   K 4.5 08/04/2020   CO2 23 08/04/2020   GLUCOSE 89 08/04/2020   BUN 16 08/04/2020   CREATININE 0.81 08/04/2020   BILITOT <0.2 08/04/2020   ALKPHOS 92 08/04/2020   AST 15 08/04/2020   ALT 17 08/04/2020   PROT 7.1 08/04/2020   ALBUMIN 4.3 08/04/2020   CALCIUM 10.3 08/04/2020   Lab Results  Component Value Date   CHOL 330 (H) 08/04/2020   Lab Results  Component Value Date   HDL 75 08/04/2020   Lab Results  Component Value Date   LDLCALC 177 (H) 08/04/2020   Lab Results  Component Value Date   TRIG 398 (H) 08/04/2020   Lab Results  Component Value Date   CHOLHDL 4.4 08/04/2020   Lab Results  Component Value Date   HGBA1C 7.6 (H) 08/04/2020      Assessment & Plan:   Wynona was seen today for hypertension.  Diagnoses and all orders for this visit:  Primary hypertension Not at goal. Increase hydrodiuril to 50 mg daily. Continue to check BP at  home and notify provider if BP is not at goal.  -     hydrochlorothiazide (HYDRODIURIL) 50 MG tablet; Take 1 tablet (50 mg total) by mouth daily.  Vertigo Try meclizine for vertigo. Return to office for new or worsening symptoms, or if symptoms persist.  -     meclizine (ANTIVERT) 25 MG tablet; Take 1 tablet (25 mg total) by mouth 3 (three) times daily as needed for dizziness.  Pencilling of stools Xray today in office. Moderate stool burden, no obstruction. Miralax OTC BID x 7 days for constipation. Referral to GI for colonoscopy. Return to office for new or worsening symptoms, or if symptoms persist.  -     DG Abd 1 View; Future -     Ambulatory referral to Gastroenterology  Screening for colon cancer -     Ambulatory referral to Gastroenterology   Follow-up: Return in about 6 weeks (around 11/02/2020) for chronic follow up. Sooner if needed.   The patient indicates understanding of these issues and agrees with the plan.   Gwenlyn Perking, FNP

## 2020-09-20 NOTE — Patient Instructions (Signed)
Constipation, Adult Constipation is when a person has fewer than three bowel movements in a week, has difficulty having a bowel movement, or has stools (feces) that are dry, hard, or larger than normal. Constipation may be caused by an underlying condition. It may become worse with age if a person takes certain medicines and does not take in enough fluids. Follow these instructions at home: Eating and drinking  Eat foods that have a lot of fiber, such as beans, whole grains, and fresh fruits and vegetables.  Limit foods that are low in fiber and high in fat and processed sugars, such as fried or sweet foods. These include french fries, hamburgers, cookies, candies, and soda.  Drink enough fluid to keep your urine pale yellow.   General instructions  Exercise regularly or as told by your health care provider. Try to do 150 minutes of moderate exercise each week.  Use the bathroom when you have the urge to go. Do not hold it in.  Take over-the-counter and prescription medicines only as told by your health care provider. This includes any fiber supplements.  During bowel movements: ? Practice deep breathing while relaxing the lower abdomen. ? Practice pelvic floor relaxation.  Watch your condition for any changes. Let your health care provider know about them.  Keep all follow-up visits as told by your health care provider. This is important. Contact a health care provider if:  You have pain that gets worse.  You have a fever.  You do not have a bowel movement after 4 days.  You vomit.  You are not hungry or you lose weight.  You are bleeding from the opening between the buttocks (anus).  You have thin, pencil-like stools. Get help right away if:  You have a fever and your symptoms suddenly get worse.  You leak stool or have blood in your stool.  Your abdomen is bloated.  You have severe pain in your abdomen.  You feel dizzy or you faint. Summary  Constipation is  when a person has fewer than three bowel movements in a week, has difficulty having a bowel movement, or has stools (feces) that are dry, hard, or larger than normal.  Eat foods that have a lot of fiber, such as beans, whole grains, and fresh fruits and vegetables.  Drink enough fluid to keep your urine pale yellow.  Take over-the-counter and prescription medicines only as told by your health care provider. This includes any fiber supplements. This information is not intended to replace advice given to you by your health care provider. Make sure you discuss any questions you have with your health care provider. Document Revised: 06/11/2019 Document Reviewed: 06/11/2019 Elsevier Patient Education  2021 [employer]. Vertigo Vertigo is the feeling that you or your surroundings are moving when they are not. This feeling can come and go at any time. Vertigo often goes away on its own. Vertigo can be dangerous if it occurs while you are doing something that could endanger you or others, such as driving or operating machinery. Your health care provider will do tests to determine the cause of your vertigo. Tests will also help your health care provider decide how best to treat your condition. Follow these instructions at home: Eating and drinking  Drink enough fluid to keep your urine pale yellow.  Do not drink alcohol.      Activity  Return to your normal activities as told by your health care provider. Ask your health care provider what activities are  safe for you.  In the morning, first sit up on the side of the bed. When you feel okay, stand slowly while you hold onto something until you know that your balance is fine.  Move slowly. Avoid sudden body or head movements or certain positions, as told by your health care provider.  If you have trouble walking or keeping your balance, try using a cane for stability. If you feel dizzy or unstable, sit down right away.  Avoid doing any tasks  that would cause danger to you or others if vertigo occurs.  Avoid bending down if you feel dizzy. Place items in your home so that they are easy for you to reach without leaning over.  Do not drive or use heavy machinery if you feel dizzy. General instructions  Take over-the-counter and prescription medicines only as told by your health care provider.  Keep all follow-up visits as told by your health care provider. This is important. Contact a health care provider if:  Your medicines do not relieve your vertigo or they make it worse.  You have a fever.  Your condition gets worse or you develop new symptoms.  Your family or friends notice any behavioral changes.  Your nausea or vomiting gets worse.  You have numbness or a prickling and tingling sensation in part of your body. Get help right away if you:  Have difficulty moving or speaking.  Are always dizzy.  Faint.  Develop severe headaches.  Have weakness in your hands, arms, or legs.  Have changes in your hearing or vision.  Develop a stiff neck.  Develop sensitivity to light. Summary  Vertigo is the feeling that you or your surroundings are moving when they are not.  Your health care provider will do tests to determine the cause of your vertigo.  Follow instructions for home care. You may be told to avoid certain tasks, positions, or movements.  Contact a health care provider if your medicines do not relieve your symptoms, or if you have a fever, nausea, vomiting, or changes in behavior.  Get help right away if you have severe headaches or difficulty speaking, or you develop hearing or vision problems. This information is not intended to replace advice given to you by your health care provider. Make sure you discuss any questions you have with your health care provider. Document Revised: 06/17/2018 Document Reviewed: 06/17/2018 Elsevier Patient Education  2021 [employer].

## 2020-10-06 ENCOUNTER — Telehealth: Payer: Self-pay

## 2020-10-06 DIAGNOSIS — I152 Hypertension secondary to endocrine disorders: Secondary | ICD-10-CM

## 2020-10-06 MED ORDER — AMLODIPINE BESYLATE 5 MG PO TABS: 5.0000 mg | ORAL_TABLET | Freq: Every day | ORAL | 3 refills | Status: DC

## 2020-10-06 NOTE — Telephone Encounter (Signed)
Patient seen by you on 09/20/20 for hypertension.  Hctz increased at that time from 25 mg to 50 mg.  Please advise.

## 2020-10-06 NOTE — Telephone Encounter (Signed)
I have sent in amlodipine 5 mg for her to start. Have her keep her scheduled appointment with me on 11/01/20 for chronic follow up.

## 2020-10-06 NOTE — Telephone Encounter (Signed)
Patient aware, she will add on Amlodipine, monitor blood pressure and let us know if there are any problems.

## 2020-11-01 ENCOUNTER — Ambulatory Visit (INDEPENDENT_AMBULATORY_CARE_PROVIDER_SITE_OTHER): Payer: BC Managed Care – PPO | Admitting: Family Medicine

## 2020-11-01 ENCOUNTER — Encounter: Payer: Self-pay | Admitting: Family Medicine

## 2020-11-01 ENCOUNTER — Other Ambulatory Visit: Payer: Self-pay

## 2020-11-01 VITALS — BP 160/90 | HR 74 | Temp 97.5°F | Ht 63.0 in | Wt 184.1 lb

## 2020-11-01 DIAGNOSIS — E1159 Type 2 diabetes mellitus with other circulatory complications: Secondary | ICD-10-CM

## 2020-11-01 DIAGNOSIS — E785 Hyperlipidemia, unspecified: Secondary | ICD-10-CM | POA: Diagnosis not present

## 2020-11-01 DIAGNOSIS — I10 Essential (primary) hypertension: Secondary | ICD-10-CM

## 2020-11-01 DIAGNOSIS — F331 Major depressive disorder, recurrent, moderate: Secondary | ICD-10-CM | POA: Diagnosis not present

## 2020-11-01 DIAGNOSIS — R42 Dizziness and giddiness: Secondary | ICD-10-CM

## 2020-11-01 DIAGNOSIS — E119 Type 2 diabetes mellitus without complications: Secondary | ICD-10-CM | POA: Diagnosis not present

## 2020-11-01 DIAGNOSIS — E1169 Type 2 diabetes mellitus with other specified complication: Secondary | ICD-10-CM | POA: Diagnosis not present

## 2020-11-01 DIAGNOSIS — I152 Hypertension secondary to endocrine disorders: Secondary | ICD-10-CM

## 2020-11-01 LAB — BAYER DCA HB A1C WAIVED: HB A1C (BAYER DCA - WAIVED): 7.8 % — ABNORMAL HIGH (ref <7.0)

## 2020-11-01 MED ORDER — OZEMPIC (1 MG/DOSE) 2 MG/1.5ML ~~LOC~~ SOPN: 1.0000 mg | PEN_INJECTOR | SUBCUTANEOUS | 20 refills | Status: DC

## 2020-11-01 MED ORDER — FLUOXETINE HCL 40 MG PO CAPS: 40.0000 mg | ORAL_CAPSULE | Freq: Every day | ORAL | 0 refills | Status: DC

## 2020-11-01 MED ORDER — ATORVASTATIN CALCIUM 20 MG PO TABS: 20.0000 mg | ORAL_TABLET | Freq: Every day | ORAL | 0 refills | Status: DC

## 2020-11-01 MED ORDER — METFORMIN HCL 1000 MG PO TABS
1000.0000 mg | ORAL_TABLET | Freq: Two times a day (BID) | ORAL | 3 refills | Status: DC
Start: 2020-11-01 — End: 2021-02-03

## 2020-11-01 MED ORDER — HYDROCHLOROTHIAZIDE 50 MG PO TABS: 50.0000 mg | ORAL_TABLET | Freq: Every day | ORAL | 3 refills | Status: DC

## 2020-11-01 MED ORDER — AMLODIPINE BESYLATE 10 MG PO TABS: 10.0000 mg | ORAL_TABLET | Freq: Every day | ORAL | 2 refills | Status: DC

## 2020-11-01 MED ORDER — LISINOPRIL 20 MG PO TABS: 40.0000 mg | ORAL_TABLET | Freq: Every day | ORAL | 3 refills | Status: DC

## 2020-11-01 MED ORDER — MECLIZINE HCL 25 MG PO TABS: 25.0000 mg | ORAL_TABLET | Freq: Three times a day (TID) | ORAL | 0 refills | Status: DC | PRN

## 2020-11-01 NOTE — Progress Notes (Signed)
Established Patient Office Visit  Subjective:  Patient ID: Kelsey Porter, female    DOB: 09-04-1955  Age: 65 y.o. MRN: [id number]  CC:  Chief Complaint  Patient presents with  . Medical Management of Chronic Issues    HPI Kelsey Porter presents for chronic follow up.  1. DM Patient denies foot ulcerations, paresthesia of the feet and visual disturbances.  Current diabetic medications include: metformin, ozempic Compliant with meds - Yes  Current monitoring regimen: home blood tests - daily Home blood sugar records: fasting range: 170-200 Any episodes of hypoglycemia? no  Current diet: improving Current exercise: none   Urine microalbumin UTD? Yes Is She on ACE inhibitor or angiotensin II receptor blocker?  Yes, lisinopril 40 mg Is She on statin? Yes    2. HTN Complaint with meds - Yes Current Medications - norvasc, lisinopril Checking BP at home ranging 333L sytolic Pertinent ROS:  Headache - No Fatigue - No Visual Disturbances - No Chest pain - No Dyspnea - No Palpitations - No LE edema - No They report good compliance with medications and can restate their regimen by memory. No medication side effects.  Family, social, and smoking history reviewed.   BP Readings from Last 3 Encounters:  11/01/20 (!) 160/90  09/20/20 (!) 154/81  08/19/20 (!) 174/93     3. Depression Depression screen [facility] 2/9 11/01/2020 08/04/2020  Decreased Interest 1 1  Down, Depressed, Hopeless 1 3  PHQ - 2 Score 2 4  Altered sleeping 0 2  Tired, decreased energy 1 1  Change in appetite 2 0  Feeling bad or failure about yourself  0 0  Trouble concentrating 0 0  Moving slowly or fidgety/restless 0 0  Suicidal thoughts 0 0  PHQ-9 Score 5 7  Difficult doing work/chores Not difficult at all Very difficult     Past Medical History:  Diagnosis Date  . Allergy   . Arthritis   . Depression   . Diabetes mellitus without complication ([place])   . History of Bell's palsy    . Hyperlipidemia   . Hypertension     Past Surgical History:  Procedure Laterality Date  . BACK SURGERY  2002  . COLON SURGERY     had part of her colon removed    Family History  Problem Relation Age of Onset  . Alcohol abuse Mother   . Arthritis Mother   . COPD Mother   . Diabetes Mother   . Heart disease Mother   . Hyperlipidemia Mother   . Hypertension Mother   . Kidney disease Mother   . Cancer Father        lung  . Diabetes Sister   . Arthritis Maternal Grandmother   . Diabetes Maternal Grandmother   . Stroke Maternal Grandmother   . Anxiety disorder Brother   . Diabetes Brother     Social History   Socioeconomic History  . Marital status: Married    Spouse name: Legrand Como  . Number of children: 2  . Years of education: 71  . Highest education level: Associate degree: occupational, Hotel manager, or vocational program  Occupational History  . Occupation: Retired  Tobacco Use  . Smoking status: Current Every Day Smoker    Packs/day: 1.00    Years: 40.00    Pack years: 40.00    Types: Cigarettes  . Smokeless tobacco: Never Used  Vaping Use  . Vaping Use: Never used  Substance and Sexual Activity  . Alcohol use: Never  . Drug  use: Never  . Sexual activity: Yes    Birth control/protection: Post-menopausal  Other Topics Concern  . Not on file  Social History Narrative  . Not on file   Social Determinants of Health   Financial Resource Strain: Not on file  Food Insecurity: Not on file  Transportation Needs: Not on file  Physical Activity: Not on file  Stress: Not on file  Social Connections: Not on file  Intimate Partner Violence: Not on file    Outpatient Medications Prior to Visit  Medication Sig Dispense Refill  . amLODipine (NORVASC) 5 MG tablet Take 1 tablet (5 mg total) by mouth daily. 90 tablet 3  . atorvastatin (LIPITOR) 20 MG tablet Take 1 tablet (20 mg total) by mouth at bedtime. 90 tablet 0  . FLUoxetine (PROZAC) 40 MG capsule Take 1  capsule (40 mg total) by mouth daily. 90 capsule 0  . hydrochlorothiazide (HYDRODIURIL) 50 MG tablet Take 1 tablet (50 mg total) by mouth daily. 90 tablet 3  . lisinopril (ZESTRIL) 40 MG tablet Take 1 tablet (40 mg total) by mouth daily. 90 tablet 0  . meclizine (ANTIVERT) 25 MG tablet Take 1 tablet (25 mg total) by mouth 3 (three) times daily as needed for dizziness. 30 tablet 0  . Melatonin 10 MG TABS Take by mouth.    . metFORMIN (GLUCOPHAGE) 1000 MG tablet Take 1,000 mg by mouth 2 (two) times daily.    Marland Kitchen omeprazole (PRILOSEC OTC) 20 MG tablet Take 20 mg by mouth every other day.    . Semaglutide,0.25 or 0.5MG /DOS, (OZEMPIC, 0.25 OR 0.5 MG/DOSE,) 2 MG/1.5ML SOPN Inject 0.5 mg into the skin once a week. Inject 0.25 mg into the skin once a week for 4 weeks. Then, inject 0.5 mg into the skin once a week. 1.5 mL 3  . triamcinolone (KENALOG) 0.1 % Apply 1 application topically 2 (two) times daily. 30 g 0  . TURMERIC PO Take by mouth.    . oxybutynin (DITROPAN XL) 10 MG 24 hr tablet Take 1 tablet (10 mg total) by mouth at bedtime. 90 tablet 2   No facility-administered medications prior to visit.    Allergies  Allergen Reactions  . Bee Venom Anaphylaxis  . Penicillin G Rash    ROS Review of Systems As per HPI.    Objective:    Physical Exam Vitals and nursing note reviewed.  Constitutional:      General: She is not in acute distress.    Appearance: Normal appearance. She is well-developed. She is not ill-appearing, toxic-appearing or diaphoretic.  HENT:     Nose: Nose normal.  Neck:     Thyroid: No thyromegaly.     Vascular: No carotid bruit or JVD.     Trachea: Trachea normal.  Cardiovascular:     Rate and Rhythm: Normal rate and regular rhythm.     Heart sounds: Normal heart sounds. No murmur heard. No friction rub. No gallop.   Pulmonary:     Effort: Pulmonary effort is normal.     Breath sounds: Normal breath sounds.  Abdominal:     General: Bowel sounds are normal.  There is no distension.     Palpations: Abdomen is soft. There is no mass.     Tenderness: There is no abdominal tenderness.  Musculoskeletal:        General: Normal range of motion.     Cervical back: Full passive range of motion without pain, normal range of motion and neck supple.  Lymphadenopathy:  Cervical: No cervical adenopathy.  Skin:    General: Skin is warm and dry.  Neurological:     Mental Status: She is alert and oriented to person, place, and time.     Deep Tendon Reflexes: Reflexes are normal and symmetric.  Psychiatric:        Behavior: Behavior normal.        Thought Content: Thought content normal.        Judgment: Judgment normal.     BP (!) 160/90   Pulse 74   Temp (!) 97.5 F (36.4 C) (Temporal)   Ht $R'5\' 3"'wN$  (1.6 m)   Wt 184 lb 2 oz (83.5 kg)   BMI 32.62 kg/m  Wt Readings from Last 3 Encounters:  11/01/20 184 lb 2 oz (83.5 kg)  09/20/20 185 lb (83.9 kg)  08/04/20 197 lb 6 oz (89.5 kg)   Diabetic Foot Exam - Simple   Simple Foot Form Diabetic Foot exam was performed with the following findings: Yes 11/01/2020  8:30 AM  Visual Inspection No deformities, no ulcerations, no other skin breakdown bilaterally: Yes Sensation Testing Intact to touch and monofilament testing bilaterally: Yes Pulse Check Posterior Tibialis and Dorsalis pulse intact bilaterally: Yes Comments      Health Maintenance Due  Topic Date Due  . PAP SMEAR-Modifier  Never done  . COLONOSCOPY (Pts 45-43yrs Insurance coverage will need to be confirmed)  Never done    There are no preventive care reminders to display for this patient.  Lab Results  Component Value Date   TSH 2.250 08/04/2020   Lab Results  Component Value Date   WBC 10.4 08/04/2020   HGB 15.5 08/04/2020   HCT 46.4 08/04/2020   MCV 89 08/04/2020   PLT 383 08/04/2020   Lab Results  Component Value Date   NA 140 08/04/2020   K 4.5 08/04/2020   CO2 23 08/04/2020   GLUCOSE 89 08/04/2020   BUN 16  08/04/2020   CREATININE 0.81 08/04/2020   BILITOT <0.2 08/04/2020   ALKPHOS 92 08/04/2020   AST 15 08/04/2020   ALT 17 08/04/2020   PROT 7.1 08/04/2020   ALBUMIN 4.3 08/04/2020   CALCIUM 10.3 08/04/2020   Lab Results  Component Value Date   CHOL 330 (H) 08/04/2020   Lab Results  Component Value Date   HDL 75 08/04/2020   Lab Results  Component Value Date   LDLCALC 177 (H) 08/04/2020   Lab Results  Component Value Date   TRIG 398 (H) 08/04/2020   Lab Results  Component Value Date   CHOLHDL 4.4 08/04/2020   Lab Results  Component Value Date   HGBA1C 7.6 (H) 08/04/2020      Assessment & Plan:   Kelsey Porter was seen today for medical management of chronic issues.  Diagnoses and all orders for this visit:  Type 2 diabetes mellitus without complication, without long-term current use of insulin (HCC) A1C 7.8 today. Increase ozempic to 1 mg weekly. Continue metformin. Labs pending as below. Food exam today. Reminded to get eye exam when able. Patient would like to meet with Almyra Free again, follow up with her in 4 weeks.  -     CBC with Differential/Platelet -     CMP14+EGFR -     Bayer DCA Hb A1c Waived -     Semaglutide, 1 MG/DOSE, (OZEMPIC, 1 MG/DOSE,) 2 MG/1.5ML SOPN; Inject 1 mg into the skin once a week. -     metFORMIN (GLUCOPHAGE) 1000 MG tablet; Take 1  tablet (1,000 mg total) by mouth 2 (two) times daily.  Hyperlipidemia associated with type 2 diabetes mellitus ([place]) Has restarted statin. Diet, exercise. Labs pending.  -     CBC with Differential/Platelet -     CMP14+EGFR -     Lipid panel -     atorvastatin (LIPITOR) 20 MG tablet; Take 1 tablet (20 mg total) by mouth at bedtime.  Hypertension associated with diabetes ([place]) Not at goal. Increase Norvasc. Continue HCTZ and lisinopril. Recheck BP in 4 weeks. Labs pending.  -     amLODipine (NORVASC) 10 MG tablet; Take 1 tablet (10 mg total) by mouth daily.       -     hydrochlorothiazide (HYDRODIURIL) 50 MG tablet;  Take 1       tablet (50 mg total) by mouth daily.  Moderate episode of recurrent major depressive disorder (HCC) Well controlled on current regimen.  -     FLUoxetine (PROZAC) 40 MG capsule; Take 1 capsule (40 mg total) by mouth daily.  Vertigo Meclinzine as needed.  -     meclizine (ANTIVERT) 25 MG tablet; Take 1 tablet (25 mg total) by mouth 3 (three) times daily as needed for dizziness.    Follow-up: Return in about 4 weeks (around 11/29/2020) for Almyra Free for DM, 3 months with me for chronic follow up, pap.   The patient indicates understanding of these issues and agrees with the plan.    Gwenlyn Perking, FNP

## 2020-11-01 NOTE — H&P (View-Only) (Signed)
Primary Care Physician:  Gwenlyn Perking, FNP  Primary Gastroenterologist:  Elon Alas. Abbey Chatters, DO   Chief Complaint  Patient presents with  . change in bowels    Was having pencil thin bowels. Started after starting procardia and it made her constipated. She is having normal BM now. HX colectomy about 20 yrs ago. Had 8 inches removed d/t abscess.     HPI:  Kelsey Porter is a 65 y.o. female here at the request of Kelsey Smolder, FNP for further evaluation of "pencilling of stools". H/o colon surgery years ago she was in her 35s, for abscess.  Chronic mild constipation since her colon surgery. Typically controls with diet/exercise. High fiber diet. Plenty of water. One week on procardia for BP, stools became thin. Xray in 09/2020 showed moderate stool but no obstruction.  Since stopping the medication, her bowel movements have returned to normal.  Bowel movement regular.Rare heartburn with certain foods. No dysphagia. No n/v. Uses omeprazole as needed. No melena, brbpr.  In the last 7-8 years, has lost 115 pounds, all natural.    Current Outpatient Medications  Medication Sig Dispense Refill  . amLODipine (NORVASC) 10 MG tablet Take 1 tablet (10 mg total) by mouth daily. 90 tablet 2  . atorvastatin (LIPITOR) 20 MG tablet Take 1 tablet (20 mg total) by mouth at bedtime. 90 tablet 0  . FLUoxetine (PROZAC) 40 MG capsule Take 1 capsule (40 mg total) by mouth daily. 90 capsule 0  . hydrochlorothiazide (HYDRODIURIL) 50 MG tablet Take 1 tablet (50 mg total) by mouth daily. 90 tablet 3  . lisinopril (ZESTRIL) 20 MG tablet Take 2 tablets (40 mg total) by mouth daily. 180 tablet 3  . meclizine (ANTIVERT) 25 MG tablet Take 1 tablet (25 mg total) by mouth 3 (three) times daily as needed for dizziness. 30 tablet 0  . Melatonin 10 MG TABS Take 10 mg by mouth at bedtime.    . metFORMIN (GLUCOPHAGE) 1000 MG tablet Take 1 tablet (1,000 mg total) by mouth 2 (two) times daily. 180 tablet 3  . omeprazole  (PRILOSEC OTC) 20 MG tablet Take 20 mg by mouth every other day.    . Semaglutide, 1 MG/DOSE, (OZEMPIC, 1 MG/DOSE,) 2 MG/1.5ML SOPN Inject 1 mg into the skin once a week. (Patient taking differently: Inject 1 mg into the skin once a week. diabetes) 1.5 mL 20  . triamcinolone (KENALOG) 0.1 % Apply 1 application topically 2 (two) times daily. (Patient taking differently: Apply 1 application topically 2 (two) times daily. As needed) 30 g 0  . TURMERIC PO Take by mouth daily.     No current facility-administered medications for this visit.    Allergies as of 11/02/2020 - Review Complete 11/02/2020  Allergen Reaction Noted  . Bee venom Anaphylaxis 08/10/2017  . Penicillin g Rash 02/27/2015    Past Medical History:  Diagnosis Date  . Allergy   . Arthritis   . Depression   . Diabetes mellitus without complication ([place])   . History of Bell's palsy   . Hyperlipidemia   . Hypertension     Past Surgical History:  Procedure Laterality Date  . BACK SURGERY  2002  . COLON SURGERY     had part of her colon removed    Family History  Problem Relation Age of Onset  . Alcohol abuse Mother   . Arthritis Mother   . COPD Mother   . Diabetes Mother   . Heart disease Mother   . Hyperlipidemia Mother   .  Hypertension Mother   . Kidney disease Mother   . Cancer Father        lung  . Diabetes Sister   . Arthritis Maternal Grandmother   . Diabetes Maternal Grandmother   . Stroke Maternal Grandmother   . Anxiety disorder Brother   . Diabetes Brother   . Colon cancer Neg Hx     Social History   Socioeconomic History  . Marital status: Married    Spouse name: Legrand Como  . Number of children: 2  . Years of education: 19  . Highest education level: Associate degree: occupational, Hotel manager, or vocational program  Occupational History  . Occupation: Retired  Tobacco Use  . Smoking status: Current Every Day Smoker    Packs/day: 1.00    Years: 40.00    Pack years: 40.00    Types:  Cigarettes  . Smokeless tobacco: Never Used  Vaping Use  . Vaping Use: Never used  Substance and Sexual Activity  . Alcohol use: Never  . Drug use: Never  . Sexual activity: Yes    Birth control/protection: Post-menopausal  Other Topics Concern  . Not on file  Social History Narrative  . Not on file   Social Determinants of Health   Financial Resource Strain: Not on file  Food Insecurity: Not on file  Transportation Needs: Not on file  Physical Activity: Not on file  Stress: Not on file  Social Connections: Not on file  Intimate Partner Violence: Not on file      ROS:  General: Negative for anorexia, weight loss, fever, chills, fatigue, weakness. Eyes: Negative for vision changes.  ENT: Negative for hoarseness, difficulty swallowing , nasal congestion. CV: Negative for chest pain, angina, palpitations, dyspnea on exertion, peripheral edema.  Respiratory: Negative for dyspnea at rest, dyspnea on exertion, cough, sputum, wheezing.  GI: See history of present illness. GU:  Negative for dysuria, hematuria, urinary incontinence, urinary frequency, nocturnal urination.  MS: Negative for joint pain, low back pain.  Derm: Negative for rash or itching.  Neuro: Negative for weakness, abnormal sensation, seizure, frequent headaches, memory loss, confusion.  Psych: Negative for anxiety, depression, suicidal ideation, hallucinations.  Endo: Negative for unusual weight change.  Heme: Negative for bruising or bleeding. Allergy: Negative for rash or hives.    Physical Examination:  BP 136/82   Pulse (!) 104   Temp (!) 97.1 F (36.2 C)   Ht 5\' 3"  (1.6 m)   Wt 183 lb 12.8 oz (83.4 kg)   BMI 32.56 kg/m    General: Well-nourished, well-developed in no acute distress.  Head: Normocephalic, atraumatic.   Eyes: Conjunctiva pink, no icterus. Mouth: masked Neck: Supple without thyromegaly, masses, or lymphadenopathy.  Lungs: Clear to auscultation bilaterally.  Heart: Regular rate  and rhythm, no murmurs rubs or gallops.  Abdomen: Bowel sounds are normal, nontender, nondistended, no hepatosplenomegaly or masses, no abdominal bruits or    hernia , no rebound or guarding.   Rectal: not performed Extremities: No lower extremity edema. No clubbing or deformities.  Neuro: Alert and oriented x 4 , grossly normal neurologically.  Skin: Warm and dry, no rash or jaundice.   Psych: Alert and cooperative, normal mood and affect.  Labs: Lab Results  Component Value Date   CREATININE 1.12 (H) 11/01/2020   BUN 24 11/01/2020   NA 139 11/01/2020   K 4.9 11/01/2020   CL 100 11/01/2020   CO2 20 11/01/2020   Lab Results  Component Value Date   HGBA1C 7.8 (H)  11/01/2020   Lab Results  Component Value Date   ALT 17 11/01/2020   AST 16 11/01/2020   ALKPHOS 92 11/01/2020   BILITOT <0.2 11/01/2020   Lab Results  Component Value Date   WBC 10.2 11/01/2020   HGB 12.9 11/01/2020   HCT 38.7 11/01/2020   MCV 91 11/01/2020   PLT 348 11/01/2020     Imaging Studies: No results found.  Assessment/plan:  Pleasant 65 year old female presenting to schedule first-ever colonoscopy today.  She has remote history of partial colectomy for abscess when she was in her 75s.  Further details unavailable.  Generally since her surgery 20 years ago, she has had been to be more proactive to keep bowel movements regular.  Consumes a high-fiber diet, plenty of water, exercises regularly.  The force last 7 to 8 years she is lost 115 pounds.  No family history of colon cancer.  Currently has no GI symptoms.  Desires screening colonoscopy.  Plan for colonoscopy in the near future with Dr. Abbey Chatters. ASA II.  I have discussed the risks, alternatives, benefits with regards to but not limited to the risk of reaction to medication, bleeding, infection, perforation and the patient is agreeable to proceed. Written consent to be obtained.

## 2020-11-01 NOTE — Progress Notes (Signed)
Primary Care Physician:  Gwenlyn Perking, FNP  Primary Gastroenterologist:  Elon Alas. Abbey Chatters, DO   Chief Complaint  Patient presents with  . change in bowels    Was having pencil thin bowels. Started after starting procardia and it made her constipated. She is having normal BM now. HX colectomy about 20 yrs ago. Had 8 inches removed d/t abscess.     HPI:  Kelsey Porter is a 65 y.o. female here at the request of [name], FNP for further evaluation of "pencilling of stools". H/o colon surgery years ago she was in her 41s, for abscess.  Chronic mild constipation since her colon surgery. Typically controls with diet/exercise. High fiber diet. Plenty of water. One week on procardia for BP, stools became thin. Xray in 09/2020 showed moderate stool but no obstruction.  Since stopping the medication, her bowel movements have returned to normal.  Bowel movement regular.Rare heartburn with certain foods. No dysphagia. No n/v. Uses omeprazole as needed. No melena, brbpr.  In the last 7-8 years, has lost 115 pounds, all natural.    Current Outpatient Medications  Medication Sig Dispense Refill  . amLODipine (NORVASC) 10 MG tablet Take 1 tablet (10 mg total) by mouth daily. 90 tablet 2  . atorvastatin (LIPITOR) 20 MG tablet Take 1 tablet (20 mg total) by mouth at bedtime. 90 tablet 0  . FLUoxetine (PROZAC) 40 MG capsule Take 1 capsule (40 mg total) by mouth daily. 90 capsule 0  . hydrochlorothiazide (HYDRODIURIL) 50 MG tablet Take 1 tablet (50 mg total) by mouth daily. 90 tablet 3  . lisinopril (ZESTRIL) 20 MG tablet Take 2 tablets (40 mg total) by mouth daily. 180 tablet 3  . meclizine (ANTIVERT) 25 MG tablet Take 1 tablet (25 mg total) by mouth 3 (three) times daily as needed for dizziness. 30 tablet 0  . Melatonin 10 MG TABS Take 10 mg by mouth at bedtime.    . metFORMIN (GLUCOPHAGE) 1000 MG tablet Take 1 tablet (1,000 mg total) by mouth 2 (two) times daily. 180 tablet 3  . omeprazole  (PRILOSEC OTC) 20 MG tablet Take 20 mg by mouth every other day.    . Semaglutide, 1 MG/DOSE, (OZEMPIC, 1 MG/DOSE,) 2 MG/1.5ML SOPN Inject 1 mg into the skin once a week. (Patient taking differently: Inject 1 mg into the skin once a week. diabetes) 1.5 mL 20  . triamcinolone (KENALOG) 0.1 % Apply 1 application topically 2 (two) times daily. (Patient taking differently: Apply 1 application topically 2 (two) times daily. As needed) 30 g 0  . TURMERIC PO Take by mouth daily.     No current facility-administered medications for this visit.    Allergies as of 11/02/2020 - Review Complete 11/02/2020  Allergen Reaction Noted  . Bee venom Anaphylaxis 08/10/2017  . Penicillin g Rash 02/27/2015    Past Medical History:  Diagnosis Date  . Allergy   . Arthritis   . Depression   . Diabetes mellitus without complication ([place])   . History of Bell's palsy   . Hyperlipidemia   . Hypertension     Past Surgical History:  Procedure Laterality Date  . BACK SURGERY  2002  . COLON SURGERY     had part of her colon removed    Family History  Problem Relation Age of Onset  . Alcohol abuse Mother   . Arthritis Mother   . COPD Mother   . Diabetes Mother   . Heart disease Mother   . Hyperlipidemia Mother   .  Hypertension Mother   . Kidney disease Mother   . Cancer Father        lung  . Diabetes Sister   . Arthritis Maternal Grandmother   . Diabetes Maternal Grandmother   . Stroke Maternal Grandmother   . Anxiety disorder Brother   . Diabetes Brother   . Colon cancer Neg Hx     Social History   Socioeconomic History  . Marital status: Married    Spouse name: Legrand Como  . Number of children: 2  . Years of education: 33  . Highest education level: Associate degree: occupational, Hotel manager, or vocational program  Occupational History  . Occupation: Retired  Tobacco Use  . Smoking status: Current Every Day Smoker    Packs/day: 1.00    Years: 40.00    Pack years: 40.00    Types:  Cigarettes  . Smokeless tobacco: Never Used  Vaping Use  . Vaping Use: Never used  Substance and Sexual Activity  . Alcohol use: Never  . Drug use: Never  . Sexual activity: Yes    Birth control/protection: Post-menopausal  Other Topics Concern  . Not on file  Social History Narrative  . Not on file   Social Determinants of Health   Financial Resource Strain: Not on file  Food Insecurity: Not on file  Transportation Needs: Not on file  Physical Activity: Not on file  Stress: Not on file  Social Connections: Not on file  Intimate Partner Violence: Not on file      ROS:  General: Negative for anorexia, weight loss, fever, chills, fatigue, weakness. Eyes: Negative for vision changes.  ENT: Negative for hoarseness, difficulty swallowing , nasal congestion. CV: Negative for chest pain, angina, palpitations, dyspnea on exertion, peripheral edema.  Respiratory: Negative for dyspnea at rest, dyspnea on exertion, cough, sputum, wheezing.  GI: See history of present illness. GU:  Negative for dysuria, hematuria, urinary incontinence, urinary frequency, nocturnal urination.  MS: Negative for joint pain, low back pain.  Derm: Negative for rash or itching.  Neuro: Negative for weakness, abnormal sensation, seizure, frequent headaches, memory loss, confusion.  Psych: Negative for anxiety, depression, suicidal ideation, hallucinations.  Endo: Negative for unusual weight change.  Heme: Negative for bruising or bleeding. Allergy: Negative for rash or hives.    Physical Examination:  BP 136/82   Pulse (!) 104   Temp (!) 97.1 F (36.2 C)   Ht 5\' 3"  (1.6 m)   Wt 183 lb 12.8 oz (83.4 kg)   BMI 32.56 kg/m    General: Well-nourished, well-developed in no acute distress.  Head: Normocephalic, atraumatic.   Eyes: Conjunctiva pink, no icterus. Mouth: masked Neck: Supple without thyromegaly, masses, or lymphadenopathy.  Lungs: Clear to auscultation bilaterally.  Heart: Regular rate  and rhythm, no murmurs rubs or gallops.  Abdomen: Bowel sounds are normal, nontender, nondistended, no hepatosplenomegaly or masses, no abdominal bruits or    hernia , no rebound or guarding.   Rectal: not performed Extremities: No lower extremity edema. No clubbing or deformities.  Neuro: Alert and oriented x 4 , grossly normal neurologically.  Skin: Warm and dry, no rash or jaundice.   Psych: Alert and cooperative, normal mood and affect.  Labs: Lab Results  Component Value Date   CREATININE 1.12 (H) 11/01/2020   BUN 24 11/01/2020   NA 139 11/01/2020   K 4.9 11/01/2020   CL 100 11/01/2020   CO2 20 11/01/2020   Lab Results  Component Value Date   HGBA1C 7.8 (H)  11/01/2020   Lab Results  Component Value Date   ALT 17 11/01/2020   AST 16 11/01/2020   ALKPHOS 92 11/01/2020   BILITOT <0.2 11/01/2020   Lab Results  Component Value Date   WBC 10.2 11/01/2020   HGB 12.9 11/01/2020   HCT 38.7 11/01/2020   MCV 91 11/01/2020   PLT 348 11/01/2020     Imaging Studies: No results found.  Assessment/plan:  Pleasant 65 year old female presenting to schedule first-ever colonoscopy today.  She has remote history of partial colectomy for abscess when she was in her 86s.  Further details unavailable.  Generally since her surgery 20 years ago, she has had been to be more proactive to keep bowel movements regular.  Consumes a high-fiber diet, plenty of water, exercises regularly.  The force last 7 to 8 years she is lost 115 pounds.  No family history of colon cancer.  Currently has no GI symptoms.  Desires screening colonoscopy.  Plan for colonoscopy in the near future with Dr. Abbey Chatters. ASA II.  I have discussed the risks, alternatives, benefits with regards to but not limited to the risk of reaction to medication, bleeding, infection, perforation and the patient is agreeable to proceed. Written consent to be obtained.

## 2020-11-01 NOTE — Patient Instructions (Signed)
Diabetes Mellitus and Foot Care Foot care is an important part of your health, especially when you have diabetes. Diabetes may cause you to have problems because of poor blood flow (circulation) to your feet and legs, which can cause your skin to:  Become thinner and drier.  Break more easily.  Heal more slowly.  Peel and crack. You may also have nerve damage (neuropathy) in your legs and feet, causing decreased feeling in them. This means that you may not notice minor injuries to your feet that could lead to more serious problems. Noticing and addressing any potential problems early is the best way to prevent future foot problems. How to care for your feet Foot hygiene  Wash your feet daily with warm water and mild soap. Do not use hot water. Then, pat your feet and the areas between your toes until they are completely dry. Do not soak your feet as this can dry your skin.  Trim your toenails straight across. Do not dig under them or around the cuticle. File the edges of your nails with an emery board or nail file.  Apply a moisturizing lotion or petroleum jelly to the skin on your feet and to dry, brittle toenails. Use lotion that does not contain alcohol and is unscented. Do not apply lotion between your toes.   Shoes and socks  Wear clean socks or stockings every day. Make sure they are not too tight. Do not wear knee-high stockings since they may decrease blood flow to your legs.  Wear shoes that fit properly and have enough cushioning. Always look in your shoes before you put them on to be sure there are no objects inside.  To break in new shoes, wear them for just a few hours a day. This prevents injuries on your feet. Wounds, scrapes, corns, and calluses  Check your feet daily for blisters, cuts, bruises, sores, and redness. If you cannot see the bottom of your feet, use a mirror or ask someone for help.  Do not cut corns or calluses or try to remove them with medicine.  If you  find a minor scrape, cut, or break in the skin on your feet, keep it and the skin around it clean and dry. You may clean these areas with mild soap and water. Do not clean the area with peroxide, alcohol, or iodine.  If you have a wound, scrape, corn, or callus on your foot, look at it several times a day to make sure it is healing and not infected. Check for: ? Redness, swelling, or pain. ? Fluid or blood. ? Warmth. ? Pus or a bad smell.   General tips  Do not cross your legs. This may decrease blood flow to your feet.  Do not use heating pads or hot water bottles on your feet. They may burn your skin. If you have lost feeling in your feet or legs, you may not know this is happening until it is too late.  Protect your feet from hot and cold by wearing shoes, such as at the beach or on hot pavement.  Schedule a complete foot exam at least once a year (annually) or more often if you have foot problems. Report any cuts, sores, or bruises to your health care provider immediately. Where to find more information  American Diabetes Association: www.diabetes.org  Association of Diabetes Care & Education Specialists: www.diabeteseducator.org Contact a health care provider if:  You have a medical condition that increases your risk of infection and   you have any cuts, sores, or bruises on your feet.  You have an injury that is not healing.  You have redness on your legs or feet.  You feel burning or tingling in your legs or feet.  You have pain or cramps in your legs and feet.  Your legs or feet are numb.  Your feet always feel cold.  You have pain around any toenails. Get help right away if:  You have a wound, scrape, corn, or callus on your foot and: ? You have pain, swelling, or redness that gets worse. ? You have fluid or blood coming from the wound, scrape, corn, or callus. ? Your wound, scrape, corn, or callus feels warm to the touch. ? You have pus or a bad smell coming from  the wound, scrape, corn, or callus. ? You have a fever. ? You have a red line going up your leg. Summary  Check your feet every day for blisters, cuts, bruises, sores, and redness.  Apply a moisturizing lotion or petroleum jelly to the skin on your feet and to dry, brittle toenails.  Wear shoes that fit properly and have enough cushioning.  If you have foot problems, report any cuts, sores, or bruises to your health care provider immediately.  Schedule a complete foot exam at least once a year (annually) or more often if you have foot problems. This information is not intended to replace advice given to you by your health care provider. Make sure you discuss any questions you have with your health care provider. Document Revised: 02/12/2020 Document Reviewed: 02/12/2020 Elsevier Patient Education  2021 Elsevier Inc.  

## 2020-11-02 ENCOUNTER — Encounter: Payer: Self-pay | Admitting: Gastroenterology

## 2020-11-02 ENCOUNTER — Other Ambulatory Visit: Payer: Self-pay

## 2020-11-02 ENCOUNTER — Ambulatory Visit (INDEPENDENT_AMBULATORY_CARE_PROVIDER_SITE_OTHER): Payer: BC Managed Care – PPO | Admitting: Gastroenterology

## 2020-11-02 DIAGNOSIS — Z1211 Encounter for screening for malignant neoplasm of colon: Secondary | ICD-10-CM | POA: Insufficient documentation

## 2020-11-02 LAB — CBC WITH DIFFERENTIAL/PLATELET
Basophils Absolute: 0.1 10*3/uL (ref 0.0–0.2)
Basos: 1 %
EOS (ABSOLUTE): 0.2 10*3/uL (ref 0.0–0.4)
Eos: 2 %
Hematocrit: 38.7 % (ref 34.0–46.6)
Hemoglobin: 12.9 g/dL (ref 11.1–15.9)
Immature Grans (Abs): 0.1 10*3/uL (ref 0.0–0.1)
Immature Granulocytes: 1 %
Lymphocytes Absolute: 1.7 10*3/uL (ref 0.7–3.1)
Lymphs: 16 %
MCH: 30.3 pg (ref 26.6–33.0)
MCHC: 33.3 g/dL (ref 31.5–35.7)
MCV: 91 fL (ref 79–97)
Monocytes Absolute: 0.8 10*3/uL (ref 0.1–0.9)
Monocytes: 8 %
Neutrophils Absolute: 7.4 10*3/uL — ABNORMAL HIGH (ref 1.4–7.0)
Neutrophils: 72 %
Platelets: 348 10*3/uL (ref 150–450)
RBC: 4.26 x10E6/uL (ref 3.77–5.28)
RDW: 13.1 % (ref 11.7–15.4)
WBC: 10.2 10*3/uL (ref 3.4–10.8)

## 2020-11-02 LAB — CMP14+EGFR
ALT: 17 IU/L (ref 0–32)
AST: 16 IU/L (ref 0–40)
Albumin/Globulin Ratio: 1.7 (ref 1.2–2.2)
Albumin: 4.5 g/dL (ref 3.8–4.8)
Alkaline Phosphatase: 92 IU/L (ref 44–121)
BUN/Creatinine Ratio: 21 (ref 12–28)
BUN: 24 mg/dL (ref 8–27)
Bilirubin Total: <0.2 mg/dL (ref 0.0–1.2)
CO2: 20 mmol/L (ref 20–29)
Calcium: 9.9 mg/dL (ref 8.7–10.3)
Chloride: 100 mmol/L (ref 96–106)
Creatinine, Ser: 1.12 mg/dL — ABNORMAL HIGH (ref 0.57–1.00)
Globulin, Total: 2.6 g/dL (ref 1.5–4.5)
Glucose: 168 mg/dL — ABNORMAL HIGH (ref 65–99)
Potassium: 4.9 mmol/L (ref 3.5–5.2)
Sodium: 139 mmol/L (ref 134–144)
Total Protein: 7.1 g/dL (ref 6.0–8.5)
eGFR: 55 mL/min/{1.73_m2} — ABNORMAL LOW (ref >59)

## 2020-11-02 LAB — LIPID PANEL
Chol/HDL Ratio: 3 ratio (ref 0.0–4.4)
Cholesterol, Total: 221 mg/dL — ABNORMAL HIGH (ref 100–199)
HDL: 73 mg/dL (ref >39)
LDL Chol Calc (NIH): 126 mg/dL — ABNORMAL HIGH (ref 0–99)
Triglycerides: 126 mg/dL (ref 0–149)
VLDL Cholesterol Cal: 22 mg/dL (ref 5–40)

## 2020-11-02 MED ORDER — PEG 3350-KCL-NA BICARB-NACL 420 G PO SOLR: 4000.0000 mL | ORAL | 0 refills | Status: DC

## 2020-11-02 NOTE — Patient Instructions (Signed)
1. Colonoscopy as scheduled. Please see separate instructions. 

## 2020-11-03 ENCOUNTER — Other Ambulatory Visit: Payer: Self-pay | Admitting: Family Medicine

## 2020-11-03 ENCOUNTER — Other Ambulatory Visit: Payer: Self-pay

## 2020-11-03 DIAGNOSIS — N289 Disorder of kidney and ureter, unspecified: Secondary | ICD-10-CM

## 2020-11-03 DIAGNOSIS — E785 Hyperlipidemia, unspecified: Secondary | ICD-10-CM

## 2020-11-03 DIAGNOSIS — E119 Type 2 diabetes mellitus without complications: Secondary | ICD-10-CM

## 2020-11-03 DIAGNOSIS — E1169 Type 2 diabetes mellitus with other specified complication: Secondary | ICD-10-CM

## 2020-11-03 MED ORDER — ATORVASTATIN CALCIUM 40 MG PO TABS: 40.0000 mg | ORAL_TABLET | Freq: Every day | ORAL | 3 refills | Status: DC

## 2020-11-26 ENCOUNTER — Other Ambulatory Visit: Payer: Self-pay

## 2020-11-26 ENCOUNTER — Other Ambulatory Visit (HOSPITAL_COMMUNITY)
Admission: RE | Admit: 2020-11-26 | Discharge: 2020-11-26 | Disposition: A | Discharge status: Home / Self Care | Payer: BC Managed Care – PPO | Source: Ambulatory Visit | Attending: Internal Medicine | Admitting: Internal Medicine

## 2020-11-26 DIAGNOSIS — Z1211 Encounter for screening for malignant neoplasm of colon: Secondary | ICD-10-CM | POA: Diagnosis not present

## 2020-11-26 DIAGNOSIS — K648 Other hemorrhoids: Secondary | ICD-10-CM | POA: Diagnosis not present

## 2020-11-26 DIAGNOSIS — Z880 Allergy status to penicillin: Secondary | ICD-10-CM | POA: Diagnosis not present

## 2020-11-26 DIAGNOSIS — D124 Benign neoplasm of descending colon: Secondary | ICD-10-CM | POA: Diagnosis not present

## 2020-11-26 DIAGNOSIS — Z01812 Encounter for preprocedural laboratory examination: Secondary | ICD-10-CM | POA: Insufficient documentation

## 2020-11-26 DIAGNOSIS — Z20822 Contact with and (suspected) exposure to covid-19: Secondary | ICD-10-CM | POA: Insufficient documentation

## 2020-11-26 DIAGNOSIS — Z7984 Long term (current) use of oral hypoglycemic drugs: Secondary | ICD-10-CM | POA: Diagnosis not present

## 2020-11-26 DIAGNOSIS — Z79899 Other long term (current) drug therapy: Secondary | ICD-10-CM | POA: Diagnosis not present

## 2020-11-26 DIAGNOSIS — K5730 Diverticulosis of large intestine without perforation or abscess without bleeding: Secondary | ICD-10-CM | POA: Diagnosis not present

## 2020-11-26 DIAGNOSIS — F17210 Nicotine dependence, cigarettes, uncomplicated: Secondary | ICD-10-CM | POA: Diagnosis not present

## 2020-11-26 DIAGNOSIS — Z9049 Acquired absence of other specified parts of digestive tract: Secondary | ICD-10-CM | POA: Diagnosis not present

## 2020-11-26 LAB — BASIC METABOLIC PANEL
Anion gap: 14 (ref 5–15)
BUN: 52 mg/dL — ABNORMAL HIGH (ref 8–23)
CO2: 20 mmol/L — ABNORMAL LOW (ref 22–32)
Calcium: 9.6 mg/dL (ref 8.9–10.3)
Chloride: 99 mmol/L (ref 98–111)
Creatinine, Ser: 1.59 mg/dL — ABNORMAL HIGH (ref 0.44–1.00)
GFR, Estimated: 36 mL/min — ABNORMAL LOW (ref >60)
Glucose, Bld: 93 mg/dL (ref 70–99)
Potassium: 4 mmol/L (ref 3.5–5.1)
Sodium: 133 mmol/L — ABNORMAL LOW (ref 135–145)

## 2020-11-27 LAB — SARS CORONAVIRUS 2 (TAT 6-24 HRS): SARS Coronavirus 2: NEGATIVE

## 2020-11-29 ENCOUNTER — Other Ambulatory Visit: Payer: Self-pay

## 2020-11-29 ENCOUNTER — Encounter (HOSPITAL_COMMUNITY)
Admission: RE | Disposition: A | Discharge status: Home / Self Care | Payer: Self-pay | Source: Home / Self Care | Attending: Internal Medicine

## 2020-11-29 ENCOUNTER — Encounter (HOSPITAL_COMMUNITY): Payer: Self-pay

## 2020-11-29 ENCOUNTER — Ambulatory Visit (HOSPITAL_COMMUNITY): Payer: BC Managed Care – PPO | Admitting: Anesthesiology

## 2020-11-29 ENCOUNTER — Ambulatory Visit (HOSPITAL_COMMUNITY)
Admission: RE | Admit: 2020-11-29 | Discharge: 2020-11-29 | Disposition: A | Discharge status: Home / Self Care | Payer: BC Managed Care – PPO | Attending: Internal Medicine | Admitting: Internal Medicine

## 2020-11-29 DIAGNOSIS — Z79899 Other long term (current) drug therapy: Secondary | ICD-10-CM | POA: Insufficient documentation

## 2020-11-29 DIAGNOSIS — K648 Other hemorrhoids: Secondary | ICD-10-CM | POA: Insufficient documentation

## 2020-11-29 DIAGNOSIS — Z9049 Acquired absence of other specified parts of digestive tract: Secondary | ICD-10-CM | POA: Insufficient documentation

## 2020-11-29 DIAGNOSIS — E119 Type 2 diabetes mellitus without complications: Secondary | ICD-10-CM | POA: Diagnosis not present

## 2020-11-29 DIAGNOSIS — K5730 Diverticulosis of large intestine without perforation or abscess without bleeding: Secondary | ICD-10-CM | POA: Diagnosis not present

## 2020-11-29 DIAGNOSIS — Z20822 Contact with and (suspected) exposure to covid-19: Secondary | ICD-10-CM | POA: Diagnosis not present

## 2020-11-29 DIAGNOSIS — F17210 Nicotine dependence, cigarettes, uncomplicated: Secondary | ICD-10-CM | POA: Diagnosis not present

## 2020-11-29 DIAGNOSIS — Z7984 Long term (current) use of oral hypoglycemic drugs: Secondary | ICD-10-CM | POA: Insufficient documentation

## 2020-11-29 DIAGNOSIS — Z1211 Encounter for screening for malignant neoplasm of colon: Secondary | ICD-10-CM | POA: Diagnosis not present

## 2020-11-29 DIAGNOSIS — D124 Benign neoplasm of descending colon: Secondary | ICD-10-CM | POA: Diagnosis not present

## 2020-11-29 DIAGNOSIS — K635 Polyp of colon: Secondary | ICD-10-CM | POA: Diagnosis not present

## 2020-11-29 DIAGNOSIS — Z880 Allergy status to penicillin: Secondary | ICD-10-CM | POA: Diagnosis not present

## 2020-11-29 HISTORY — PX: COLONOSCOPY WITH PROPOFOL: SHX5780

## 2020-11-29 HISTORY — PX: POLYPECTOMY: SHX5525

## 2020-11-29 LAB — GLUCOSE, CAPILLARY: Glucose-Capillary: 145 mg/dL — ABNORMAL HIGH (ref 70–99)

## 2020-11-29 SURGERY — COLONOSCOPY WITH PROPOFOL
Anesthesia: General

## 2020-11-29 MED ORDER — LIDOCAINE HCL (CARDIAC) PF 100 MG/5ML IV SOSY
PREFILLED_SYRINGE | INTRAVENOUS | Status: DC | PRN
  Administered 2020-11-29: 40 mg via INTRATRACHEAL

## 2020-11-29 MED ORDER — PROPOFOL 10 MG/ML IV BOLUS
INTRAVENOUS | Status: DC | PRN
  Administered 2020-11-29: 100 mg via INTRAVENOUS
  Administered 2020-11-29: 30 mg via INTRAVENOUS

## 2020-11-29 MED ORDER — LACTATED RINGERS IV SOLN: INTRAVENOUS | Status: DC

## 2020-11-29 MED ORDER — PHENYLEPHRINE HCL (PRESSORS) 10 MG/ML IV SOLN
INTRAVENOUS | Status: DC | PRN
  Administered 2020-11-29: 80 ug via INTRAVENOUS
  Administered 2020-11-29 (×2): 120 ug via INTRAVENOUS

## 2020-11-29 MED ORDER — STERILE WATER FOR IRRIGATION IR SOLN
Status: DC | PRN
  Administered 2020-11-29: 1.5 mL

## 2020-11-29 MED ORDER — PROPOFOL 500 MG/50ML IV EMUL
INTRAVENOUS | Status: DC | PRN
  Administered 2020-11-29: 150 ug/kg/min via INTRAVENOUS

## 2020-11-29 NOTE — Op Note (Signed)
[facility] Patient Name: Monalisa Bayless Procedure Date: 11/29/2020 9:52 AM MRN: [id number] Date of Birth: 17-Jun-1956 Attending MD: Elon Alas. Edgar Frisk CSN: [id number] Age: 65 Admit Type: Outpatient Procedure:                Colonoscopy Indications:              Screening for colorectal malignant neoplasm Providers:                Elon Alas. Abbey Chatters, DO, Tacy Learn,                            Technician Referring MD:              Medicines:                See the Anesthesia note for documentation of the                            administered medications Complications:            No immediate complications. Estimated Blood Loss:     Estimated blood loss was minimal. Procedure:                Pre-Anesthesia Assessment:                           - The anesthesia plan was to use monitored                            anesthesia care (MAC).                           After obtaining informed consent, the colonoscope                            was passed under direct vision. Throughout the                            procedure, the patient's blood pressure, pulse, and                            oxygen saturations were monitored continuously. The                            [id number]-HQ190L ([id number]) scope was introduced through                            the anus and advanced to the the cecum, identified                            by appendiceal orifice and ileocecal valve. The                            colonoscopy was performed without difficulty. The                            patient tolerated the procedure well. The quality  of the bowel preparation was evaluated using the                            BBPS [facility] Bowel Preparation Scale) with scores                            of: Right Colon = 2 (minor amount of residual                            staining, small fragments of stool and/or opaque                            liquid, but mucosa seen  well), Transverse Colon = 2                            (minor amount of residual staining, small fragments                            of stool and/or opaque liquid, but mucosa seen                            well) and Left Colon = 2 (minor amount of residual                            staining, small fragments of stool and/or opaque                            liquid, but mucosa seen well). The total BBPS score                            equals 6. The quality of the bowel preparation was                            fair. Scope In: 10:02:40 AM Scope Out: 10:18:16 AM Scope Withdrawal Time: 0 hours 12 minutes 36 seconds  Total Procedure Duration: 0 hours 15 minutes 36 seconds  Findings:      The perianal and digital rectal examinations were normal.      Non-bleeding internal hemorrhoids were found during endoscopy.      Multiple small-mouthed diverticula were found in the sigmoid colon.      Two sessile polyps were found in the descending colon. The polyps were 4       to 6 mm in size. These polyps were removed with a cold snare. Resection       and retrieval were complete.      Two sessile polyps were found in the descending colon. The polyps were 2       mm in size. These polyps were removed with a cold biopsy forceps.       Resection and retrieval were complete. Impression:               - Preparation of the colon was fair.                           -  Non-bleeding internal hemorrhoids.                           - Diverticulosis in the sigmoid colon.                           - Two 4 to 6 mm polyps in the descending colon,                            removed with a cold snare. Resected and retrieved.                           - Two 2 mm polyps in the descending colon, removed                            with a cold biopsy forceps. Resected and retrieved. Moderate Sedation:      Per Anesthesia Care Recommendation:           - Patient has a contact number available for                             emergencies. The signs and symptoms of potential                            delayed complications were discussed with the                            patient. Return to normal activities tomorrow.                            Written discharge instructions were provided to the                            patient.                           - Resume previous diet.                           - Continue present medications.                           - Await pathology results.                           - Repeat colonoscopy in 5 years for surveillance.                           - Return to GI clinic PRN. Procedure Code(s):        --- Professional ---                           [phone], Colonoscopy, flexible; with removal of                            tumor(s), polyp(s), or  other lesion(s) by snare                            technique                           45380, 59, Colonoscopy, flexible; with biopsy,                            single or multiple Diagnosis Code(s):        --- Professional ---                           Z12.11, Encounter for screening for malignant                            neoplasm of colon                           K64.8, Other hemorrhoids                           K63.5, Polyp of colon                           K57.30, Diverticulosis of large intestine without                            perforation or abscess without bleeding CPT copyright 2019 American Medical Association. All rights reserved. The codes documented in this report are preliminary and upon coder review may  be revised to meet current compliance requirements. Elon Alas. Abbey Chatters, DO [place] Abbey Chatters, DO 11/29/2020 10:21:28 AM This report has been signed electronically. Number of Addenda: 0

## 2020-11-29 NOTE — Discharge Instructions (Addendum)
Colonoscopy Discharge Instructions  Read the instructions outlined below and refer to this sheet in the next few weeks. These discharge instructions provide you with general information on caring for yourself after you leave the hospital. Your doctor may also give you specific instructions. While your treatment has been planned according to the most current medical practices available, unavoidable complications occasionally occur.   ACTIVITY  You may resume your regular activity, but move at a slower pace for the next 24 hours.   Take frequent rest periods for the next 24 hours.   Walking will help get rid of the air and reduce the bloated feeling in your belly (abdomen).   No driving for 24 hours (because of the medicine (anesthesia) used during the test).    Do not sign any important legal documents or operate any machinery for 24 hours (because of the anesthesia used during the test).  NUTRITION  Drink plenty of fluids.   You may resume your normal diet as instructed by your doctor.   Begin with a light meal and progress to your normal diet. Heavy or fried foods are harder to digest and may make you feel sick to your stomach (nauseated).   Avoid alcoholic beverages for 24 hours or as instructed.  MEDICATIONS  You may resume your normal medications unless your doctor tells you otherwise.  WHAT YOU CAN EXPECT TODAY  Some feelings of bloating in the abdomen.   Passage of more gas than usual.   Spotting of blood in your stool or on the toilet paper.  IF YOU HAD POLYPS REMOVED DURING THE COLONOSCOPY:  No aspirin products for 7 days or as instructed.   No alcohol for 7 days or as instructed.   Eat a soft diet for the next 24 hours.  FINDING OUT THE RESULTS OF YOUR TEST Not all test results are available during your visit. If your test results are not back during the visit, make an appointment with your caregiver to find out the results. Do not assume everything is normal if  you have not heard from your caregiver or the medical facility. It is important for you to follow up on all of your test results.  SEEK IMMEDIATE MEDICAL ATTENTION IF:  You have more than a spotting of blood in your stool.   Your belly is swollen (abdominal distention).   You are nauseated or vomiting.   You have a temperature over 101.   You have abdominal pain or discomfort that is severe or gets worse throughout the day.   Your colonoscopy revealed 4 polyp(s) which I removed successfully. Await pathology results, my office will contact you. I recommend repeating colonoscopy in 5 years for surveillance purposes. You also have diverticulosis and internal hemorrhoids. I would recommend increasing fiber in your diet or adding OTC Benefiber/Metamucil. Be sure to drink at least 4 to 6 glasses of water daily. Follow-up with GI as needed.    I hope you have a great rest of your week!  Elon Alas. Abbey Chatters, D.O. Gastroenterology and Hepatology [facility]   Colon Polyps  Colon polyps are tissue growths inside the colon, which is part of the large intestine. They are one of the types of polyps that can grow in the body. A polyp may be a round bump or a mushroom-shaped growth. You could have one polyp or more than one. Most colon polyps are noncancerous (benign). However, some colon polyps can become cancerous over time. Finding and removing the polyps early  can help prevent this. What are the causes? The exact cause of colon polyps is not known. What increases the risk? The following factors may make you more likely to develop this condition:  Having a family history of colorectal cancer or colon polyps.  Being older than 65 years of age.  Being younger than 65 years of age and having a significant family history of colorectal cancer or colon polyps or a genetic condition that puts you at higher risk of getting colon polyps.  Having inflammatory bowel disease,  such as ulcerative colitis or Crohn's disease.  Having certain conditions passed from parent to child (hereditary conditions), such as: ? Familial adenomatous polyposis (FAP). ? Lynch syndrome. ? Turcot syndrome. ? Peutz-Jeghers syndrome. ? MUTYH-associated polyposis (MAP).  Being overweight.  Certain lifestyle factors. These include smoking cigarettes, drinking too much alcohol, not getting enough exercise, and eating a diet that is high in fat and red meat and low in fiber.  Having had childhood cancer that was treated with radiation of the abdomen. What are the signs or symptoms? Many times, there are no symptoms. If you have symptoms, they may include:  Blood coming from the rectum during a bowel movement.  Blood in the stool (feces). The blood may be bright red or very dark in color.  Pain in the abdomen.  A change in bowel habits, such as constipation or diarrhea. How is this diagnosed? This condition is diagnosed with a colonoscopy. This is a procedure in which a lighted, flexible scope is inserted into the opening between the buttocks (anus) and then passed into the colon to examine the area. Polyps are sometimes found when a colonoscopy is done as part of routine cancer screening tests. How is this treated? This condition is treated by removing any polyps that are found. Most polyps can be removed during a colonoscopy. Those polyps will then be tested for cancer. Additional treatment may be needed depending on the results of testing. Follow these instructions at home: Eating and drinking  Eat foods that are high in fiber, such as fruits, vegetables, and whole grains.  Eat foods that are high in calcium and vitamin D, such as milk, cheese, yogurt, eggs, liver, fish, and broccoli.  Limit foods that are high in fat, such as fried foods and desserts.  Limit the amount of red meat, precooked or cured meat, or other processed meat that you eat, such as hot dogs, sausages,  bacon, or meat loaves.  Limit sugary drinks.   Lifestyle  Maintain a healthy weight, or lose weight if recommended by your health care provider.  Exercise every day or as told by your health care provider.  Do not use any products that contain nicotine or tobacco, such as cigarettes, e-cigarettes, and chewing tobacco. If you need help quitting, ask your health care provider.  Do not drink alcohol if: ? Your health care provider tells you not to drink. ? You are pregnant, may be pregnant, or are planning to become pregnant.  If you drink alcohol: ? Limit how much you use to:  0-1 drink a day for women.  0-2 drinks a day for men. ? Know how much alcohol is in your drink. In the [place] one drink equals one 12 oz bottle of beer (355 mL), one 5 oz glass of wine (148 mL), or one 1 oz glass of hard liquor (44 mL). General instructions  Take over-the-counter and prescription medicines only as told by your health care provider.  Keep  all follow-up visits. This is important. This includes having regularly scheduled colonoscopies. Talk to your health care provider about when you need a colonoscopy. Contact a health care provider if:  You have new or worsening bleeding during a bowel movement.  You have new or increased blood in your stool.  You have a change in bowel habits.  You lose weight for no known reason. Summary  Colon polyps are tissue growths inside the colon, which is part of the large intestine. They are one type of polyp that can grow in the body.  Most colon polyps are noncancerous (benign), but some can become cancerous over time.  This condition is diagnosed with a colonoscopy.  This condition is treated by removing any polyps that are found. Most polyps can be removed during a colonoscopy. This information is not intended to replace advice given to you by your health care provider. Make sure you discuss any questions you have with your health care  provider. Document Revised: 11/12/2019 Document Reviewed: 11/12/2019 Elsevier Patient Education  2021 [place].  Diverticulosis  Diverticulosis is a condition that develops when small pouches (diverticula) form in the wall of the large intestine (colon). The colon is where water is absorbed and stool (feces) is formed. The pouches form when the inside layer of the colon pushes through weak spots in the outer layers of the colon. You may have a few pouches or many of them. The pouches usually do not cause problems unless they become inflamed or infected. When this happens, the condition is called diverticulitis. What are the causes? The cause of this condition is not known. What increases the risk? The following factors may make you more likely to develop this condition:  Being older than age 39. Your risk for this condition increases with age. Diverticulosis is rare among people younger than age 19. By age 64, many people have it.  Eating a low-fiber diet.  Having frequent constipation.  Being overweight.  Not getting enough exercise.  Smoking.  Taking over-the-counter pain medicines, like aspirin and ibuprofen.  Having a family history of diverticulosis. What are the signs or symptoms? In most people, there are no symptoms of this condition. If you do have symptoms, they may include:  Bloating.  Cramps in the abdomen.  Constipation or diarrhea.  Pain in the lower left side of the abdomen. How is this diagnosed? Because diverticulosis usually has no symptoms, it is most often diagnosed during an exam for other colon problems. The condition may be diagnosed by:  Using a flexible scope to examine the colon (colonoscopy).  Taking an X-ray of the colon after dye has been put into the colon (barium enema).  Having a CT scan. How is this treated? You may not need treatment for this condition. Your health care provider may recommend treatment to prevent problems. You may  need treatment if you have symptoms or if you previously had diverticulitis. Treatment may include:  Eating a high-fiber diet.  Taking a fiber supplement.  Taking a live bacteria supplement (probiotic).  Taking medicine to relax your colon.   Follow these instructions at home: Medicines  Take over-the-counter and prescription medicines only as told by your health care provider.  If told by your health care provider, take a fiber supplement or probiotic. Constipation prevention Your condition may cause constipation. To prevent or treat constipation, you may need to:  Drink enough fluid to keep your urine pale yellow.  Take over-the-counter or prescription medicines.  Eat foods  that are high in fiber, such as beans, whole grains, and fresh fruits and vegetables.  Limit foods that are high in fat and processed sugars, such as fried or sweet foods.   General instructions  Try not to strain when you have a bowel movement.  Keep all follow-up visits as told by your health care provider. This is important. Contact a health care provider if you:  Have pain in your abdomen.  Have bloating.  Have cramps.  Have not had a bowel movement in 3 days. Get help right away if:  Your pain gets worse.  Your bloating becomes very bad.  You have a fever or chills, and your symptoms suddenly get worse.  You vomit.  You have bowel movements that are bloody or black.  You have bleeding from your rectum. Summary  Diverticulosis is a condition that develops when small pouches (diverticula) form in the wall of the large intestine (colon).  You may have a few pouches or many of them.  This condition is most often diagnosed during an exam for other colon problems.  Treatment may include increasing the fiber in your diet, taking supplements, or taking medicines. This information is not intended to replace advice given to you by your health care provider. Make sure you discuss any  questions you have with your health care provider. Document Revised: 02/20/2019 Document Reviewed: 02/20/2019 Elsevier Patient Education  2021 [place].  Hemorrhoids Hemorrhoids are swollen veins that may develop:  In the butt (rectum). These are called internal hemorrhoids.  Around the opening of the butt (anus). These are called external hemorrhoids. Hemorrhoids can cause pain, itching, or bleeding. Most of the time, they do not cause serious problems. They usually get better with diet changes, lifestyle changes, and other home treatments. What are the causes? This condition may be caused by:  Having trouble pooping (constipation).  Pushing hard (straining) to poop.  Watery poop (diarrhea).  Pregnancy.  Being very overweight (obese).  Sitting for long periods of time.  Heavy lifting or other activity that causes you to strain.  Anal sex.  Riding a bike for a long period of time. What are the signs or symptoms? Symptoms of this condition include:  Pain.  Itching or soreness in the butt.  Bleeding from the butt.  Leaking poop.  Swelling in the area.  One or more lumps around the opening of your butt. How is this diagnosed? A doctor can often diagnose this condition by looking at the affected area. The doctor may also:  Do an exam that involves feeling the area with a gloved hand (digital rectal exam).  Examine the area inside your butt using a small tube (anoscope).  Order blood tests. This may be done if you have lost a lot of blood.  Have you get a test that involves looking inside the colon using a flexible tube with a camera on the end (sigmoidoscopy or colonoscopy). How is this treated? This condition can usually be treated at home. Your doctor may tell you to change what you eat, make lifestyle changes, or try home treatments. If these do not help, procedures can be done to remove the hemorrhoids or make them smaller. These may involve:  Placing  rubber bands at the base of the hemorrhoids to cut off their blood supply.  Injecting medicine into the hemorrhoids to shrink them.  Shining a type of light energy onto the hemorrhoids to cause them to fall off.  Doing surgery to remove the  hemorrhoids or cut off their blood supply. Follow these instructions at home: Eating and drinking  Eat foods that have a lot of fiber in them. These include whole grains, beans, nuts, fruits, and vegetables.  Ask your doctor about taking products that have added fiber (fibersupplements).  Reduce the amount of fat in your diet. You can do this by: ? Eating low-fat dairy products. ? Eating less red meat. ? Avoiding processed foods.  Drink enough fluid to keep your pee (urine) pale yellow.   Managing pain and swelling  Take a warm-water bath (sitz bath) for 20 minutes to ease pain. Do this 3-4 times a day. You may do this in a bathtub or using a portable sitz bath that fits over the toilet.  If told, put ice on the painful area. It may be helpful to use ice between your warm baths. ? Put ice in a plastic bag. ? Place a towel between your skin and the bag. ? Leave the ice on for 20 minutes, 2-3 times a day.   General instructions  Take over-the-counter and prescription medicines only as told by your doctor. ? Medicated creams and medicines may be used as told.  Exercise often. Ask your doctor how much and what kind of exercise is best for you.  Go to the bathroom when you have the urge to poop. Do not wait.  Avoid pushing too hard when you poop.  Keep your butt dry and clean. Use wet toilet paper or moist towelettes after pooping.  Do not sit on the toilet for a long time.  Keep all follow-up visits as told by your doctor. This is important. Contact a doctor if you:  Have pain and swelling that do not get better with treatment or medicine.  Have trouble pooping.  Cannot poop.  Have pain or swelling outside the area of the  hemorrhoids. Get help right away if you have:  Bleeding that will not stop. Summary  Hemorrhoids are swollen veins in the butt or around the opening of the butt.  They can cause pain, itching, or bleeding.  Eat foods that have a lot of fiber in them. These include whole grains, beans, nuts, fruits, and vegetables.  Take a warm-water bath (sitz bath) for 20 minutes to ease pain. Do this 3-4 times a day. This information is not intended to replace advice given to you by your health care provider. Make sure you discuss any questions you have with your health care provider. Document Revised: 08/01/2018 Document Reviewed: 12/13/2017 Elsevier Patient Education  2021 [place].  High-Fiber Eating Plan Fiber, also called dietary fiber, is a type of carbohydrate. It is found foods such as fruits, vegetables, whole grains, and beans. A high-fiber diet can have many health benefits. Your health care provider may recommend a high-fiber diet to help:  Prevent constipation. Fiber can make your bowel movements more regular.  Lower your cholesterol.  Relieve the following conditions: ? Inflammation of veins in the anus (hemorrhoids). ? Inflammation of specific areas of the digestive tract (uncomplicated diverticulosis). ? A problem of the large intestine, also called the colon, that sometimes causes pain and diarrhea (irritable bowel syndrome, or IBS).  Prevent overeating as part of a weight-loss plan.  Prevent heart disease, type 2 diabetes, and certain cancers. What are tips for following this plan? Reading food labels  Check the nutrition facts label on food products for the amount of dietary fiber. Choose foods that have 5 grams of fiber or more  per serving.  The goals for recommended daily fiber intake include: ? Men (age 56 or younger): 34-38 g. ? Men (over age 64): 28-34 g. ? Women (age 44 or younger): 25-28 g. ? Women (over age 53): 22-25 g. Your daily fiber goal is  _____________ g.   Shopping  Choose whole fruits and vegetables instead of processed forms, such as apple juice or applesauce.  Choose a wide variety of high-fiber foods such as avocados, lentils, oats, and kidney beans.  Read the nutrition facts label of the foods you choose. Be aware of foods with added fiber. These foods often have high sugar and sodium amounts per serving. Cooking  Use whole-grain flour for baking and cooking.  Cook with brown rice instead of white rice. Meal planning  Start the day with a breakfast that is high in fiber, such as a cereal that contains 5 g of fiber or more per serving.  Eat breads and cereals that are made with whole-grain flour instead of refined flour or white flour.  Eat brown rice, bulgur wheat, or millet instead of white rice.  Use beans in place of meat in soups, salads, and pasta dishes.  Be sure that half of the grains you eat each day are whole grains. General information  You can get the recommended daily intake of dietary fiber by: ? Eating a variety of fruits, vegetables, grains, nuts, and beans. ? Taking a fiber supplement if you are not able to take in enough fiber in your diet. It is better to get fiber through food than from a supplement.  Gradually increase how much fiber you consume. If you increase your intake of dietary fiber too quickly, you may have bloating, cramping, or gas.  Drink plenty of water to help you digest fiber.  Choose high-fiber snacks, such as berries, raw vegetables, nuts, and popcorn. What foods should I eat? Fruits Berries. Pears. Apples. Oranges. Avocado. Prunes and raisins. Dried figs. Vegetables Sweet potatoes. Spinach. Kale. Artichokes. Cabbage. Broccoli. Cauliflower. Green peas. Carrots. Squash. Grains Whole-grain breads. Multigrain cereal. Oats and oatmeal. Brown rice. Barley. Bulgur wheat. [place]. Quinoa. Bran muffins. Popcorn. Rye wafer crackers. Meats and other proteins Navy beans,  kidney beans, and pinto beans. Soybeans. Split peas. Lentils. Nuts and seeds. Dairy Fiber-fortified yogurt. Beverages Fiber-fortified soy milk. Fiber-fortified orange juice. Other foods Fiber bars. The items listed above may not be a complete list of recommended foods and beverages. Contact a dietitian for more information. What foods should I avoid? Fruits Fruit juice. Cooked, strained fruit. Vegetables Fried potatoes. Canned vegetables. Well-cooked vegetables. Grains White bread. Pasta made with refined flour. White rice. Meats and other proteins Fatty cuts of meat. Fried chicken or fried fish. Dairy Milk. Yogurt. Cream cheese. Sour cream. Fats and oils Butters. Beverages Soft drinks. Other foods Cakes and pastries. The items listed above may not be a complete list of foods and beverages to avoid. Talk with your dietitian about what choices are best for you. Summary  Fiber is a type of carbohydrate. It is found in foods such as fruits, vegetables, whole grains, and beans.  A high-fiber diet has many benefits. It can help to prevent constipation, lower blood cholesterol, aid weight loss, and reduce your risk of heart disease, diabetes, and certain cancers.  Increase your intake of fiber gradually. Increasing fiber too quickly may cause cramping, bloating, and gas. Drink plenty of water while you increase the amount of fiber you consume.  The best sources of fiber include whole fruits  and vegetables, whole grains, nuts, seeds, and beans. This information is not intended to replace advice given to you by your health care provider. Make sure you discuss any questions you have with your health care provider. Document Revised: 11/27/2019 Document Reviewed: 11/27/2019 Elsevier Patient Education  2021 [employer].

## 2020-11-29 NOTE — Transfer of Care (Signed)
Immediate Anesthesia Transfer of Care Note  Patient: Kelsey Porter  Procedure(s) Performed: COLONOSCOPY WITH PROPOFOL (N/A ) POLYPECTOMY  Patient Location: PACU  Anesthesia Type:General  Level of Consciousness: awake and alert   Airway & Oxygen Therapy: Patient Spontanous Breathing  Post-op Assessment: Report given to RN and Post -op Vital signs reviewed and stable  Post vital signs: Reviewed and stable  Last Vitals:  Vitals Value Taken Time  BP    Temp    Pulse    Resp    SpO2      Last Pain:  Vitals:   11/29/20 0954  TempSrc:   PainSc: 0-No pain      Patients Stated Pain Goal: 10 (51/70/01 [id number])  Complications: No complications documented.

## 2020-11-29 NOTE — Interval H&P Note (Signed)
History and Physical Interval Note:  11/29/2020 9:28 AM  [name]  has presented today for surgery, with the diagnosis of screeing colonoscopy.  The various methods of treatment have been discussed with the patient and family. After consideration of risks, benefits and other options for treatment, the patient has consented to  Procedure(s) with comments: COLONOSCOPY WITH PROPOFOL (N/A) - AM-diabetic as a surgical intervention.  The patient's history has been reviewed, patient examined, no change in status, stable for surgery.  I have reviewed the patient's chart and labs.  Questions were answered to the patient's satisfaction.     Kelsey Porter

## 2020-11-29 NOTE — Anesthesia Postprocedure Evaluation (Signed)
Anesthesia Post Note  Patient: Kelsey Porter  Procedure(s) Performed: COLONOSCOPY WITH PROPOFOL (N/A ) POLYPECTOMY  Patient location during evaluation: Endoscopy Anesthesia Type: General Level of consciousness: awake and alert and oriented Pain management: pain level controlled Vital Signs Assessment: post-procedure vital signs reviewed and stable Respiratory status: spontaneous breathing and respiratory function stable Cardiovascular status: blood pressure returned to baseline and stable Postop Assessment: no apparent nausea or vomiting Anesthetic complications: no   No complications documented.   Last Vitals:  Vitals:   11/29/20 1025 11/29/20 1027  BP: (!) 84/54 93/66  Pulse: 87 88  Resp: 18 18  Temp:    SpO2: 95% 96%    Last Pain:  Vitals:   11/29/20 1027  TempSrc:   PainSc: 0-No pain                 Katianna Mcclenney C Jeena Arnett

## 2020-11-29 NOTE — Anesthesia Preprocedure Evaluation (Addendum)
Anesthesia Evaluation  Patient identified by MRN, date of birth, ID band Patient awake    Reviewed: Allergy & Precautions, NPO status , Patient's Chart, lab work & pertinent test results  History of Anesthesia Complications Negative for: history of anesthetic complications  Airway Mallampati: II  TM Distance: >3 FB Neck ROM: Full    Dental  (+) Dental Advisory Given, Upper Dentures, Lower Dentures   Pulmonary Current Smoker,    Pulmonary exam normal breath sounds clear to auscultation       Cardiovascular Exercise Tolerance: Good hypertension, Pt. on medications Normal cardiovascular exam Rhythm:Regular Rate:Normal     Neuro/Psych  Neuromuscular disease    GI/Hepatic negative GI ROS, Neg liver ROS,   Endo/Other  diabetes, Well Controlled, Type 2, Oral Hypoglycemic Agents  Renal/GU negative Renal ROS     Musculoskeletal  (+) Arthritis  (back sx),   Abdominal   Peds  Hematology   Anesthesia Other Findings   Reproductive/Obstetrics                             Anesthesia Physical Anesthesia Plan  ASA: II  Anesthesia Plan: General   Post-op Pain Management:    Induction: Intravenous  PONV Risk Score and Plan: Propofol infusion  Airway Management Planned: Nasal Cannula and Natural Airway  Additional Equipment:   Intra-op Plan:   Post-operative Plan:   Informed Consent: I have reviewed the patients History and Physical, chart, labs and discussed the procedure including the risks, benefits and alternatives for the proposed anesthesia with the patient or authorized representative who has indicated his/her understanding and acceptance.     Dental advisory given  Plan Discussed with: CRNA and Surgeon  Anesthesia Plan Comments:        Anesthesia Quick Evaluation

## 2020-11-30 ENCOUNTER — Ambulatory Visit: Payer: BC Managed Care – PPO | Admitting: Pharmacist

## 2020-11-30 LAB — SURGICAL PATHOLOGY

## 2020-12-01 ENCOUNTER — Encounter (HOSPITAL_COMMUNITY): Payer: Self-pay | Admitting: Internal Medicine

## 2020-12-14 ENCOUNTER — Encounter: Payer: Self-pay | Admitting: Pharmacist

## 2020-12-14 ENCOUNTER — Ambulatory Visit (INDEPENDENT_AMBULATORY_CARE_PROVIDER_SITE_OTHER): Payer: BC Managed Care – PPO | Admitting: Pharmacist

## 2020-12-14 ENCOUNTER — Telehealth: Payer: Self-pay

## 2020-12-14 ENCOUNTER — Other Ambulatory Visit: Payer: Self-pay

## 2020-12-14 VITALS — BP 140/86

## 2020-12-14 DIAGNOSIS — E119 Type 2 diabetes mellitus without complications: Secondary | ICD-10-CM | POA: Diagnosis not present

## 2020-12-14 MED ORDER — EZETIMIBE 10 MG PO TABS: 10.0000 mg | ORAL_TABLET | Freq: Every day | ORAL | 3 refills | Status: DC

## 2020-12-14 MED ORDER — OZEMPIC (2 MG/DOSE) 8 MG/3ML ~~LOC~~ SOPN: 2.0000 mg | PEN_INJECTOR | Freq: Every day | SUBCUTANEOUS | 3 refills | Status: DC

## 2020-12-14 NOTE — Telephone Encounter (Signed)
Returned call to [facility] no answer x40 rings. Called twice  Ozempic increased to 2mg  sq weekly--can someone retry?  This dose was recently approved by FDA and product must be ordered

## 2020-12-14 NOTE — Progress Notes (Signed)
    12/14/2020 Name: Kelsey Porter MRN: [id number] DOB: [birth year]   S:  48 yoF Presents for diabetes evaluation, education, and management Patient was referred and last seen by Primary Care Provider on 11/01/20.  Insurance coverage/medication affordability: BCBS commercial  Patient reports adherence with medications. . Current diabetes medications include: metformin GFR 77, ozempic . Current hypertension medications include: lisinopril, amlodipine Goal 130/80 . Current hyperlipidemia medications include: atorvastatin  Patient denies hypoglycemic events.   Patient reported dietary habits: Eats 3 meals/day Discussed meal planning options and Plate method for healthy eating . Avoid sugary drinks and desserts . Incorporate balanced protein, non starchy veggies, 1 serving of carbohydrate with each meal . Increase water intake . Increase physical activity as able  Patient-reported exercise habits: n/a   O:  Lab Results  Component Value Date   HGBA1C 7.8 (H) 11/01/2020    Lipid Panel     Component Value Date/Time   CHOL 221 (H) 11/01/2020 0816   TRIG 126 11/01/2020 0816   HDL 73 11/01/2020 0816   CHOLHDL 3.0 11/01/2020 0816   LDLCALC 126 (H) 11/01/2020 0816    Home fasting blood sugars: 170-200  2 hour post-meal/random blood sugars: n/a.   Clinical Atherosclerotic Cardiovascular Disease (ASCVD): NO  The 10-year ASCVD risk score Mikey Bussing DC Jr., et al., 2013) is: 11.8%   Values used to calculate the score:     Age: 65 years     Sex: Female     Is Non-Hispanic African American: No     Diabetic: Yes     Tobacco smoker: Yes     Systolic Blood Pressure: 93 mmHg     Is BP treated: Yes     HDL Cholesterol: 73 mg/dL     Total Cholesterol: 221 mg/dL    A/P:  Diabetes T2DM currently uncontrolled.  Patient is adherent with medication.  -Increase Ozempic 2mg  into the skin weekly  Denies history of thyroid cancer  Patient has lost 22 lbs over 6 months; she would  like to increase dose  -Continue metformin  -Decrease atorvastatin to 20mg  due to myopathy  -Start Zetia 10mg  daily  -Extensively discussed pathophysiology of diabetes, recommended lifestyle interventions, dietary effects on blood sugar control  -Counseled on s/sx of and management of hypoglycemia  -Next A1C anticipated 02/03/21.  Written patient instructions provided.  Total time in face to face counseling 25 minutes.   Follow up PCP Clinic Visit ON 02/03/21  Regina Eck, PharmD, BCPS Clinical Pharmacist, [place]  II Phone [phone]

## 2020-12-14 NOTE — Telephone Encounter (Signed)
Please verify dosing instructions for Ozempic prescription sent today.

## 2020-12-22 ENCOUNTER — Other Ambulatory Visit: Payer: Self-pay

## 2020-12-22 ENCOUNTER — Ambulatory Visit (INDEPENDENT_AMBULATORY_CARE_PROVIDER_SITE_OTHER): Payer: BC Managed Care – PPO | Admitting: Family Medicine

## 2020-12-22 ENCOUNTER — Encounter: Payer: Self-pay | Admitting: Family Medicine

## 2020-12-22 VITALS — Temp 98.1°F | Ht 63.0 in | Wt 173.4 lb

## 2020-12-22 DIAGNOSIS — R42 Dizziness and giddiness: Secondary | ICD-10-CM

## 2020-12-22 DIAGNOSIS — E1159 Type 2 diabetes mellitus with other circulatory complications: Secondary | ICD-10-CM | POA: Diagnosis not present

## 2020-12-22 DIAGNOSIS — I152 Hypertension secondary to endocrine disorders: Secondary | ICD-10-CM | POA: Diagnosis not present

## 2020-12-22 DIAGNOSIS — I951 Orthostatic hypotension: Secondary | ICD-10-CM

## 2020-12-22 MED ORDER — MECLIZINE HCL 25 MG PO TABS: 25.0000 mg | ORAL_TABLET | Freq: Three times a day (TID) | ORAL | 0 refills | Status: DC | PRN

## 2020-12-22 MED ORDER — LISINOPRIL 20 MG PO TABS: 20.0000 mg | ORAL_TABLET | Freq: Every day | ORAL | 3 refills | Status: DC

## 2020-12-22 NOTE — Progress Notes (Signed)
Acute Office Visit  Subjective:    Patient ID: Kelsey Porter, female    DOB: February 08, 1956, 65 y.o.   MRN: [id number]  Chief Complaint  Patient presents with  . Dizziness    HPI Patient is in today for dizziness that occurs with standing and head movements for 4 months. She also feels tired. Symptoms have been getting worse. She denies syncope, palpitations, flushing, tachycardia, shortness of breath, edema, or chest pain. She does sometimes have nausea when she feels dizzy. She has tried meclizine but this is only helpful if she takes it TID. She is not always staying well hydrated. She has not been able to be as active as she would like because she feels dizzy when standing.   Past Medical History:  Diagnosis Date  . Allergy   . Arthritis   . Depression   . Diabetes mellitus without complication ([place])   . History of Bell's palsy   . Hyperlipidemia   . Hypertension     Past Surgical History:  Procedure Laterality Date  . BACK SURGERY  2002  . COLON SURGERY     had part of her colon removed  . COLONOSCOPY WITH PROPOFOL N/A 11/29/2020   Procedure: COLONOSCOPY WITH PROPOFOL;  Surgeon: Eloise Harman, DO;  Location: [facility];  Service: Endoscopy;  Laterality: N/A;  AM-diabetic  . POLYPECTOMY  11/29/2020   Procedure: POLYPECTOMY;  Surgeon: Eloise Harman, DO;  Location: [facility];  Service: Endoscopy;;    Family History  Problem Relation Age of Onset  . Alcohol abuse Mother   . Arthritis Mother   . COPD Mother   . Diabetes Mother   . Heart disease Mother   . Hyperlipidemia Mother   . Hypertension Mother   . Kidney disease Mother   . Cancer Father        lung  . Diabetes Sister   . Arthritis Maternal Grandmother   . Diabetes Maternal Grandmother   . Stroke Maternal Grandmother   . Anxiety disorder Brother   . Diabetes Brother   . Colon cancer Neg Hx     Social History   Socioeconomic History  . Marital status: Married    Spouse name: Legrand Como   . Number of children: 2  . Years of education: 61  . Highest education level: Associate degree: occupational, Hotel manager, or vocational program  Occupational History  . Occupation: Retired  Tobacco Use  . Smoking status: Current Every Day Smoker    Packs/day: 1.00    Years: 40.00    Pack years: 40.00    Types: Cigarettes  . Smokeless tobacco: Never Used  Vaping Use  . Vaping Use: Never used  Substance and Sexual Activity  . Alcohol use: Never  . Drug use: Never  . Sexual activity: Yes    Birth control/protection: Post-menopausal  Other Topics Concern  . Not on file  Social History Narrative  . Not on file   Social Determinants of Health   Financial Resource Strain: Not on file  Food Insecurity: Not on file  Transportation Needs: Not on file  Physical Activity: Not on file  Stress: Not on file  Social Connections: Not on file  Intimate Partner Violence: Not on file    Outpatient Medications Prior to Visit  Medication Sig Dispense Refill  . amLODipine (NORVASC) 10 MG tablet Take 1 tablet (10 mg total) by mouth daily. 90 tablet 2  . atorvastatin (LIPITOR) 40 MG tablet Take 1 tablet (40 mg total) by  mouth daily. (Patient taking differently: Take 20 mg by mouth at bedtime.) 90 tablet 3  . FLUoxetine (PROZAC) 40 MG capsule Take 1 capsule (40 mg total) by mouth daily. 90 capsule 0  . hydrochlorothiazide (HYDRODIURIL) 50 MG tablet Take 1 tablet (50 mg total) by mouth daily. 90 tablet 3  . lisinopril (ZESTRIL) 20 MG tablet Take 2 tablets (40 mg total) by mouth daily. 180 tablet 3  . meclizine (ANTIVERT) 25 MG tablet Take 1 tablet (25 mg total) by mouth 3 (three) times daily as needed for dizziness. 30 tablet 0  . Melatonin 10 MG TABS Take 10 mg by mouth at bedtime.    . metFORMIN (GLUCOPHAGE) 1000 MG tablet Take 1 tablet (1,000 mg total) by mouth 2 (two) times daily. 180 tablet 3  . Semaglutide, 2 MG/DOSE, (OZEMPIC, 2 MG/DOSE,) 8 MG/3ML SOPN Inject 2 mg into the skin daily. 9 mL  3  . triamcinolone (KENALOG) 0.1 % Apply 1 application topically 2 (two) times daily. (Patient taking differently: Apply 1 application topically daily as needed (Rash).) 30 g 0  . Turmeric 500 MG TABS Take 500 mg by mouth at bedtime.    . ezetimibe (ZETIA) 10 MG tablet Take 1 tablet (10 mg total) by mouth daily. 90 tablet 3  . polyethylene glycol-electrolytes (TRILYTE) 420 g solution Take 4,000 mLs by mouth as directed. 4000 mL 0   No facility-administered medications prior to visit.    Allergies  Allergen Reactions  . Bee Venom Anaphylaxis  . Penicillin G Rash    Reaction: 25 years ago    Review of Systems As per HPI.     Objective:    Physical Exam Vitals and nursing note reviewed.  Constitutional:      General: She is not in acute distress.    Appearance: Normal appearance. She is not ill-appearing, toxic-appearing or diaphoretic.  HENT:     Head: Normocephalic and atraumatic.  Eyes:     Conjunctiva/sclera: Conjunctivae normal.     Pupils: Pupils are equal, round, and reactive to light.  Cardiovascular:     Rate and Rhythm: Normal rate and regular rhythm.     Heart sounds: Normal heart sounds. No murmur heard.   Pulmonary:     Effort: Pulmonary effort is normal. No respiratory distress.     Breath sounds: Normal breath sounds.  Musculoskeletal:     Right lower leg: No edema.     Left lower leg: No edema.  Skin:    General: Skin is warm and dry.  Neurological:     General: No focal deficit present.     Mental Status: She is alert and oriented to person, place, and time.     Motor: No weakness.     Coordination: Coordination normal.     Gait: Gait normal.  Psychiatric:        Mood and Affect: Mood normal.        Behavior: Behavior normal.     Temp 98.1 F (36.7 C) (Temporal)   Ht 5' 3" (1.6 m)   Wt 173 lb 6 oz (78.6 kg)   BMI 30.71 kg/m  Wt Readings from Last 3 Encounters:  12/22/20 173 lb 6 oz (78.6 kg)  11/29/20 173 lb 3.2 oz (78.6 kg)  11/02/20 183  lb 12.8 oz (83.4 kg)   Orthostatic VS for the past 72 hrs (Last 3 readings):  Orthostatic BP Patient Position Orthostatic Pulse  12/22/20 0951 119/74 Standing 114  12/22/20 0950 149/89 Sitting 106    12/22/20 0947 127/82 Supine 114    Health Maintenance Due  Topic Date Due  . OPHTHALMOLOGY EXAM  Never done  . PAP SMEAR-Modifier  Never done    There are no preventive care reminders to display for this patient.   Lab Results  Component Value Date   TSH 2.250 08/04/2020   Lab Results  Component Value Date   WBC 10.2 11/01/2020   HGB 12.9 11/01/2020   HCT 38.7 11/01/2020   MCV 91 11/01/2020   PLT 348 11/01/2020   Lab Results  Component Value Date   NA 133 (L) 11/26/2020   K 4.0 11/26/2020   CO2 20 (L) 11/26/2020   GLUCOSE 93 11/26/2020   BUN 52 (H) 11/26/2020   CREATININE 1.59 (H) 11/26/2020   BILITOT <0.2 11/01/2020   ALKPHOS 92 11/01/2020   AST 16 11/01/2020   ALT 17 11/01/2020   PROT 7.1 11/01/2020   ALBUMIN 4.5 11/01/2020   CALCIUM 9.6 11/26/2020   ANIONGAP 14 11/26/2020   EGFR 55 (L) 11/01/2020   Lab Results  Component Value Date   CHOL 221 (H) 11/01/2020   Lab Results  Component Value Date   HDL 73 11/01/2020   Lab Results  Component Value Date   LDLCALC 126 (H) 11/01/2020   Lab Results  Component Value Date   TRIG 126 11/01/2020   Lab Results  Component Value Date   CHOLHDL 3.0 11/01/2020   Lab Results  Component Value Date   HGBA1C 7.8 (H) 11/01/2020       Assessment & Plan:   Dalena was seen today for dizziness.  Diagnoses and all orders for this visit:  Dizziness Refill provided.  -     meclizine (ANTIVERT) 25 MG tablet; Take 1 tablet (25 mg total) by mouth 3 (three) times daily as needed for dizziness.  Orthostatic hypotension + orthostatic BPs today in office. Discussed this as cause of dizziness. Will decrease lisinopril as this can be contributing. Discussed hydration, slow movement, exercise. Discussed PT, however patient  reports she would not be able to afford this. She has a neighbor who is a physical therapist that she plans to work with.   Hypertension associated with diabetes (HCC) Lisinopril dosage decreased due to orthostatic hypotension. Patient will monitor BP at home and notify for elevated BPs.  -     lisinopril (ZESTRIL) 20 MG tablet; Take 1 tablet (20 mg total) by mouth daily.  Return to office for new or worsening symptoms, or if symptoms persist.   The patient indicates understanding of these issues and agrees with the plan.  Tiffany M Morgan, FNP 

## 2020-12-22 NOTE — Patient Instructions (Signed)
Keep an eye on blood pressure. Notify office for persistent readings greater than 140/90s.  If blood pressure is at goal of 130/80s or less and symptoms improve, notify me and we can try to decrease blood pressure medications further.    Orthostatic Hypotension Blood pressure is a measurement of how strongly, or weakly, your blood is pressing against the walls of your arteries. Orthostatic hypotension is a sudden drop in blood pressure that happens when you quickly change positions, such as when you get up from sitting or lying down. Arteries are blood vessels that carry blood from your heart throughout your body. When blood pressure is too low, you may not get enough blood to your brain or to the rest of your organs. This can cause weakness, light-headedness, rapid heartbeat, and fainting. This can last for just a few seconds or for up to a few minutes. Orthostatic hypotension is usually not a serious problem. However, if it happens frequently or gets worse, it may be a sign of something more serious. What are the causes? This condition may be caused by:  Sudden changes in posture, such as standing up quickly after you have been sitting or lying down.  Blood loss.  Loss of body fluids (dehydration).  Heart problems.  Hormone (endocrine) problems.  Pregnancy.  Severe infection.  Lack of certain nutrients.  Severe allergic reactions (anaphylaxis).  Certain medicines, such as blood pressure medicine or medicines that make the body lose excess fluids (diuretics). Sometimes, this condition can be caused by not taking medicine as directed, such as taking too much of a certain medicine. What increases the risk? The following factors may make you more likely to develop this condition:  Age. Risk increases as you get older.  Conditions that affect the heart or the central nervous system.  Taking certain medicines, such as blood pressure medicine or diuretics.  Being pregnant. What are  the signs or symptoms? Symptoms of this condition may include:  Weakness.  Light-headedness.  Dizziness.  Blurred vision.  Fatigue.  Rapid heartbeat.  Fainting, in severe cases. How is this diagnosed? This condition is diagnosed based on:  Your medical history.  Your symptoms.  Your blood pressure measurement. Your health care provider will check your blood pressure when you are: ? Lying down. ? Sitting. ? Standing. A blood pressure reading is recorded as two numbers, such as "120 over 80" (or 120/80). The first ("top") number is called the systolic pressure. It is a measure of the pressure in your arteries as your heart beats. The second ("bottom") number is called the diastolic pressure. It is a measure of the pressure in your arteries when your heart relaxes between beats. Blood pressure is measured in a unit called mm Hg. Healthy blood pressure for most adults is 120/80. If your blood pressure is below 90/60, you may be diagnosed with hypotension. Other information or tests that may be used to diagnose orthostatic hypotension include:  Your other vital signs, such as your heart rate and temperature.  Blood tests.  Tilt table test. For this test, you will be safely secured to a table that moves you from a lying position to an upright position. Your heart rhythm and blood pressure will be monitored during the test. How is this treated? This condition may be treated by:  Changing your diet. This may involve eating more salt (sodium) or drinking more water.  Taking medicines to raise your blood pressure.  Changing the dosage of certain medicines you are taking  that might be lowering your blood pressure.  Wearing compression stockings. These stockings help to prevent blood clots and reduce swelling in your legs. In some cases, you may need to go to the hospital for:  Fluid replacement. This means you will receive fluids through an IV.  Blood replacement. This means you  will receive donated blood through an IV (transfusion).  Treating an infection or heart problems, if this applies.  Monitoring. You may need to be monitored while medicines that you are taking wear off. Follow these instructions at home: Eating and drinking  Drink enough fluid to keep your urine pale yellow.  Eat a healthy diet, and follow instructions from your health care provider about eating or drinking restrictions. A healthy diet includes: ? Fresh fruits and vegetables. ? Whole grains. ? Lean meats. ? Low-fat dairy products.  Eat extra salt only as directed. Do not add extra salt to your diet unless your health care provider told you to do that.  Eat frequent, small meals.  Avoid standing up suddenly after eating.   Medicines  Take over-the-counter and prescription medicines only as told by your health care provider. ? Follow instructions from your health care provider about changing the dosage of your current medicines, if this applies. ? Do not stop or adjust any of your medicines on your own. General instructions  Wear compression stockings as told by your health care provider.  Get up slowly from lying down or sitting positions. This gives your blood pressure a chance to adjust.  Avoid hot showers and excessive heat as directed by your health care provider.  Return to your normal activities as told by your health care provider. Ask your health care provider what activities are safe for you.  Do not use any products that contain nicotine or tobacco, such as cigarettes, e-cigarettes, and chewing tobacco. If you need help quitting, ask your health care provider.  Keep all follow-up visits as told by your health care provider. This is important.   Contact a health care provider if you:  Vomit.  Have diarrhea.  Have a fever for more than 2-3 days.  Feel more thirsty than usual.  Feel weak and tired. Get help right away if you:  Have chest pain.  Have a fast  or irregular heartbeat.  Develop numbness in any part of your body.  Cannot move your arms or your legs.  Have trouble speaking.  Become sweaty or feel light-headed.  Faint.  Feel short of breath.  Have trouble staying awake.  Feel confused. Summary  Orthostatic hypotension is a sudden drop in blood pressure that happens when you quickly change positions.  Orthostatic hypotension is usually not a serious problem.  It is diagnosed by having your blood pressure taken lying down, sitting, and then standing.  It may be treated by changing your diet or adjusting your medicines. This information is not intended to replace advice given to you by your health care provider. Make sure you discuss any questions you have with your health care provider. Document Revised: 01/17/2018 Document Reviewed: 01/17/2018 Elsevier Patient Education  [place].

## 2020-12-23 ENCOUNTER — Encounter: Payer: Self-pay | Admitting: Family Medicine

## 2020-12-29 ENCOUNTER — Telehealth: Payer: Self-pay | Admitting: *Deleted

## 2020-12-29 ENCOUNTER — Other Ambulatory Visit: Payer: Self-pay | Admitting: Family Medicine

## 2020-12-29 DIAGNOSIS — E119 Type 2 diabetes mellitus without complications: Secondary | ICD-10-CM

## 2020-12-29 MED ORDER — OZEMPIC (2 MG/DOSE) 8 MG/3ML ~~LOC~~ SOPN
2.0000 mg | PEN_INJECTOR | SUBCUTANEOUS | 3 refills | Status: DC
Start: 2020-12-29 — End: 2021-07-07

## 2020-12-29 NOTE — Telephone Encounter (Signed)
New Rx sent for Ozempic 2 mg weekly.

## 2020-12-29 NOTE — Telephone Encounter (Signed)
Fax from [place]: Semaglutide 2 mg/dose, 8mg /34ml 2 mg daily Note from pharmacy: please clarify prescription of 2 mg daily, the pens only dial up to 7 mg per injection

## 2020-12-29 NOTE — Telephone Encounter (Signed)
If sig is weekly, can new Rx be sent, it is for daily

## 2020-12-30 ENCOUNTER — Telehealth: Payer: Self-pay | Admitting: Family Medicine

## 2020-12-30 NOTE — Telephone Encounter (Signed)
Pt aware of provider feedback and voiced understanding. 

## 2020-12-30 NOTE — Telephone Encounter (Signed)
Continue decreased dosage of lisinopril (20 mg). BP is still controlled. Follow up if dizziness persists or worsens again or for elevated BP.

## 2021-02-03 ENCOUNTER — Other Ambulatory Visit: Payer: Self-pay

## 2021-02-03 ENCOUNTER — Ambulatory Visit (INDEPENDENT_AMBULATORY_CARE_PROVIDER_SITE_OTHER): Payer: BC Managed Care – PPO | Admitting: Family Medicine

## 2021-02-03 ENCOUNTER — Other Ambulatory Visit (HOSPITAL_COMMUNITY)
Admission: RE | Admit: 2021-02-03 | Discharge: 2021-02-03 | Disposition: A | Discharge status: Home / Self Care | Payer: BC Managed Care – PPO | Source: Ambulatory Visit | Attending: Family Medicine | Admitting: Family Medicine

## 2021-02-03 VITALS — BP 116/69 | HR 105 | Temp 97.4°F | Ht 63.0 in | Wt 166.5 lb

## 2021-02-03 DIAGNOSIS — R42 Dizziness and giddiness: Secondary | ICD-10-CM

## 2021-02-03 DIAGNOSIS — E119 Type 2 diabetes mellitus without complications: Secondary | ICD-10-CM

## 2021-02-03 DIAGNOSIS — I951 Orthostatic hypotension: Secondary | ICD-10-CM

## 2021-02-03 DIAGNOSIS — I152 Hypertension secondary to endocrine disorders: Secondary | ICD-10-CM

## 2021-02-03 DIAGNOSIS — Z87892 Personal history of anaphylaxis: Secondary | ICD-10-CM

## 2021-02-03 DIAGNOSIS — E785 Hyperlipidemia, unspecified: Secondary | ICD-10-CM | POA: Diagnosis not present

## 2021-02-03 DIAGNOSIS — Z124 Encounter for screening for malignant neoplasm of cervix: Secondary | ICD-10-CM | POA: Insufficient documentation

## 2021-02-03 DIAGNOSIS — Z01411 Encounter for gynecological examination (general) (routine) with abnormal findings: Secondary | ICD-10-CM | POA: Diagnosis not present

## 2021-02-03 DIAGNOSIS — F331 Major depressive disorder, recurrent, moderate: Secondary | ICD-10-CM

## 2021-02-03 DIAGNOSIS — E1159 Type 2 diabetes mellitus with other circulatory complications: Secondary | ICD-10-CM

## 2021-02-03 DIAGNOSIS — Z01419 Encounter for gynecological examination (general) (routine) without abnormal findings: Secondary | ICD-10-CM | POA: Insufficient documentation

## 2021-02-03 DIAGNOSIS — Z91030 Bee allergy status: Secondary | ICD-10-CM

## 2021-02-03 DIAGNOSIS — E1169 Type 2 diabetes mellitus with other specified complication: Secondary | ICD-10-CM | POA: Diagnosis not present

## 2021-02-03 DIAGNOSIS — I10 Essential (primary) hypertension: Secondary | ICD-10-CM

## 2021-02-03 LAB — BAYER DCA HB A1C WAIVED: HB A1C (BAYER DCA - WAIVED): 6.5 % (ref <7.0)

## 2021-02-03 MED ORDER — FLUOXETINE HCL 40 MG PO CAPS: 40.0000 mg | ORAL_CAPSULE | Freq: Every day | ORAL | 0 refills | Status: DC

## 2021-02-03 MED ORDER — MECLIZINE HCL 25 MG PO TABS: 25.0000 mg | ORAL_TABLET | Freq: Three times a day (TID) | ORAL | 0 refills | Status: DC | PRN

## 2021-02-03 MED ORDER — EPINEPHRINE 0.3 MG/0.3ML IJ SOAJ: 0.3000 mg | INTRAMUSCULAR | 2 refills | Status: DC | PRN

## 2021-02-03 MED ORDER — METFORMIN HCL 1000 MG PO TABS: 1000.0000 mg | ORAL_TABLET | Freq: Two times a day (BID) | ORAL | 3 refills | Status: DC

## 2021-02-03 MED ORDER — ATORVASTATIN CALCIUM 20 MG PO TABS: 20.0000 mg | ORAL_TABLET | Freq: Every day | ORAL | 3 refills | Status: DC

## 2021-02-03 MED ORDER — LISINOPRIL 10 MG PO TABS: 10.0000 mg | ORAL_TABLET | Freq: Every day | ORAL | 3 refills | Status: DC

## 2021-02-03 MED ORDER — AMLODIPINE BESYLATE 10 MG PO TABS: 10.0000 mg | ORAL_TABLET | Freq: Every day | ORAL | 2 refills | Status: DC

## 2021-02-03 NOTE — Progress Notes (Signed)
Kelsey Porter is a 65 y.o. female presents to office today for annual physical exam examination.    Concerns today include: 1. None  Occupation: retired, Marital status: married, Substance use: denies Diet: low carb, low salt, healthy diet, Exercise: swimming daily Last eye exam: not in last year Last dental exam: has dentures Last colonoscopy: 11/29/20 Last mammogram: 09/01/20 Last pap smear: 20 years ago, never had abnormal one   BP at home is 119/78. She is feeling much better after decreasing her lisinopril. She is having some dizziness with standing a few times a week. She reports that the she feels much better.   She has last 31 lbs pounds in the last 6 months. She has been exercising again. She plans to sign up for tae kwon do soon. She has not been checking her blood sugars. She has been taking her medications as prescribed.   Depression screen [facility] 2/9 02/03/2021 11/01/2020 08/04/2020  Decreased Interest 1 1 1   Down, Depressed, Hopeless 1 1 3   PHQ - 2 Score 2 2 4   Altered sleeping 1 0 2  Tired, decreased energy 1 1 1   Change in appetite 0 2 0  Feeling bad or failure about yourself  0 0 0  Trouble concentrating 0 0 0  Moving slowly or fidgety/restless 0 0 0  Suicidal thoughts 0 0 0  PHQ-9 Score 4 5 7   Difficult doing work/chores Not difficult at all Not difficult at all Very difficult   GAD 7 : Generalized Anxiety Score 02/03/2021  Nervous, Anxious, on Edge 1  Control/stop worrying 1  Worry too much - different things 1  Trouble relaxing 1  Restless 0  Easily annoyed or irritable 0  Afraid - awful might happen 0  Total GAD 7 Score 4  Anxiety Difficulty Not difficult at all      Past Medical History:  Diagnosis Date   Allergy    Arthritis    Depression    Diabetes mellitus without complication (HCC)    History of Bell's palsy    Hyperlipidemia    Hypertension    Social History   Socioeconomic History   Marital status: Married    Spouse name:  Legrand Como   Number of children: 2   Years of education: 14   Highest education level: Associate degree: occupational, Hotel manager, or vocational program  Occupational History   Occupation: Retired  Tobacco Use   Smoking status: Every Day    Packs/day: 1.00    Years: 40.00    Pack years: 40.00    Types: Cigarettes   Smokeless tobacco: Never  Vaping Use   Vaping Use: Never used  Substance and Sexual Activity   Alcohol use: Never   Drug use: Never   Sexual activity: Yes    Birth control/protection: Post-menopausal  Other Topics Concern   Not on file  Social History Narrative   Not on file   Social Determinants of Health   Financial Resource Strain: Not on file  Food Insecurity: Not on file  Transportation Needs: Not on file  Physical Activity: Not on file  Stress: Not on file  Social Connections: Not on file  Intimate Partner Violence: Not on file   Past Surgical History:  Procedure Laterality Date   BACK SURGERY  2002   COLON SURGERY     had part of her colon removed   COLONOSCOPY WITH PROPOFOL N/A 11/29/2020   Procedure: COLONOSCOPY WITH PROPOFOL;  Surgeon: Eloise Harman, DO;  [facility];  Service: Endoscopy;  Laterality: N/A;  AM-diabetic   POLYPECTOMY  11/29/2020   Procedure: POLYPECTOMY;  Surgeon: Eloise Harman, DO;  Location: [facility];  Service: Endoscopy;;   Family History  Problem Relation Age of Onset   Alcohol abuse Mother    Arthritis Mother    COPD Mother    Diabetes Mother    Heart disease Mother    Hyperlipidemia Mother    Hypertension Mother    Kidney disease Mother    Cancer Father        lung   Diabetes Sister    Arthritis Maternal Grandmother    Diabetes Maternal Grandmother    Stroke Maternal Grandmother    Anxiety disorder Brother    Diabetes Brother    Colon cancer Neg Hx     Current Outpatient Medications:    amLODipine (NORVASC) 10 MG tablet, Take 1 tablet (10 mg total) by mouth daily., Disp: 90 tablet, Rfl:  2   atorvastatin (LIPITOR) 40 MG tablet, Take 1 tablet (40 mg total) by mouth daily. (Patient taking differently: Take 20 mg by mouth at bedtime.), Disp: 90 tablet, Rfl: 3   FLUoxetine (PROZAC) 40 MG capsule, Take 1 capsule (40 mg total) by mouth daily., Disp: 90 capsule, Rfl: 0   hydrochlorothiazide (HYDRODIURIL) 50 MG tablet, Take 1 tablet (50 mg total) by mouth daily., Disp: 90 tablet, Rfl: 3   lisinopril (ZESTRIL) 20 MG tablet, Take 1 tablet (20 mg total) by mouth daily., Disp: 180 tablet, Rfl: 3   Melatonin 10 MG TABS, Take 10 mg by mouth at bedtime., Disp: , Rfl:    metFORMIN (GLUCOPHAGE) 1000 MG tablet, Take 1 tablet (1,000 mg total) by mouth 2 (two) times daily., Disp: 180 tablet, Rfl: 3   Omega-3 Fatty Acids (OMEGA 3 PO), Take by mouth., Disp: , Rfl:    Semaglutide, 2 MG/DOSE, (OZEMPIC, 2 MG/DOSE,) 8 MG/3ML SOPN, Inject 2 mg into the skin once a week., Disp: 9 mL, Rfl: 3   triamcinolone (KENALOG) 0.1 %, Apply 1 application topically 2 (two) times daily. (Patient taking differently: Apply 1 application topically daily as needed (Rash).), Disp: 30 g, Rfl: 0   Turmeric 500 MG TABS, Take 500 mg by mouth at bedtime., Disp: , Rfl:    meclizine (ANTIVERT) 25 MG tablet, Take 1 tablet (25 mg total) by mouth 3 (three) times daily as needed for dizziness. (Patient not taking: Reported on 02/03/2021), Disp: 30 tablet, Rfl: 0  Allergies  Allergen Reactions   Bee Venom Anaphylaxis   Penicillin G Rash    Reaction: 25 years ago     ROS: Review of Systems Pertinent items noted in HPI and remainder of comprehensive ROS otherwise negative.    Physical exam BP 116/69   Pulse (!) 105   Temp (!) 97.4 F (36.3 C) (Oral)   Ht $R'5\' 3"'kb$  (1.6 m)   Wt 166 lb 8 oz (75.5 kg)   BMI 29.49 kg/m  General appearance: alert, cooperative, and no distress Head: Normocephalic, without obvious abnormality, atraumatic Eyes: conjunctivae/corneas clear. PERRL, EOM's intact. Fundi benign. Ears: normal TM's and external  ear canals both ears Nose: Nares normal. Septum midline. Mucosa normal. No drainage or sinus tenderness. Throat: lips, mucosa, and tongue normal; teeth and gums normal Neck: no adenopathy, no carotid bruit, no JVD, supple, symmetrical, trachea midline, and thyroid not enlarged, symmetric, no tenderness/mass/nodules Back: symmetric, no curvature. ROM normal. No CVA tenderness. Lungs: clear to auscultation bilaterally Heart: regular rate and rhythm, S1,  S2 normal, no murmur, click, rub or gallop Abdomen: soft, non-tender; bowel sounds normal; no masses,  no organomegaly Pelvic: cervix normal in appearance, external genitalia normal, no adnexal masses or tenderness, no cervical motion tenderness, rectovaginal septum normal, uterus normal size, shape, and consistency, and vagina normal without discharge Extremities: extremities normal, atraumatic, no cyanosis or edema Pulses: 2+ and symmetric Skin: Skin color, texture, turgor normal. No rashes or lesions Lymph nodes: Cervical, supraclavicular, and axillary nodes normal. Neurologic: Alert and oriented X 3, normal strength and tone. Normal symmetric reflexes. Normal coordination and gait    Assessment/ Plan: [name] here for annual physical exam.   Janey was seen today for annual exam and diabetes.  Diagnoses and all orders for this visit:  Well woman exam with routine gynecological exam Labs pending as below.  -     CBC with Differential/Platelet -     CMP14+EGFR -     Lipid panel -     Cytology - PAP  Cervical cancer screening -     Cytology - PAP  Type 2 diabetes mellitus without complication, without long-term current use of insulin (HCC) A1c 6.5 today, at goal of <7. Labs pending. Continue current regimen.  -     CBC with Differential/Platelet -     CMP14+EGFR -     Bayer DCA Hb A1c Waived -     atorvastatin (LIPITOR) 20 MG tablet; Take 1 tablet (20 mg total) by mouth at bedtime. -     metFORMIN (GLUCOPHAGE) 1000 MG  tablet; Take 1 tablet (1,000 mg total) by mouth 2 (two) times daily.  Hyperlipidemia associated with type 2 diabetes mellitus ([place]) Labs pending. On lipitor and omega 3.  -     Lipid panel  Primary hypertension Well controlled on current regimen. Will decrease lisinopril again as this significantly improved her orthostatic hypotension symptoms.  -     lisinopril (ZESTRIL) 10 MG tablet; Take 1 tablet (10 mg total) by mouth daily.       -     amLODipine (NORVASC) 10 MG tablet; Take 1 tablet (10 mg total) by mouth daily.  Orthostatic hypotension Significantly improved with decreased lisinopril dosage.   Moderate episode of recurrent major depressive disorder (HCC) Well controlled on current regimen.  -     FLUoxetine (PROZAC) 40 MG capsule; Take 1 capsule (40 mg total) by mouth daily.  Dizziness As needed.  -     meclizine (ANTIVERT) 25 MG tablet; Take 1 tablet (25 mg total) by mouth 3 (three) times daily as needed for dizziness.  Allergy to bee sting -     EPINEPHrine 0.3 mg/0.3 mL IJ SOAJ injection; Inject 0.3 mg into the muscle as needed for anaphylaxis.  History of anaphylaxis -     EPINEPHrine 0.3 mg/0.3 mL IJ SOAJ injection; Inject 0.3 mg into the muscle as needed for anaphylaxis.   Counseled on healthy lifestyle choices, including diet (rich in fruits, vegetables and lean meats and low in salt and simple carbohydrates) and exercise (at least 30 minutes of moderate physical activity daily).  Return in about 3 months (around 05/06/2021) for chronic follow up.   The above assessment and management plan was discussed with the patient. The patient verbalized understanding of and has agreed to the management plan. Patient is aware to call the clinic if symptoms persist or worsen. Patient is aware when to return to the clinic for a follow-up visit. Patient educated on when it is appropriate to go to the  emergency department.   [name], FNP-C [facility] [address] [place], [place] [address] [phone]

## 2021-02-03 NOTE — Patient Instructions (Signed)
Health Maintenance, Female Adopting a healthy lifestyle and getting preventive care are important in promoting health and wellness. Ask your health care provider about: The right schedule for you to have regular tests and exams. Things you can do on your own to prevent diseases and keep yourself healthy. What should I know about diet, weight, and exercise? Eat a healthy diet  Eat a diet that includes plenty of vegetables, fruits, low-fat dairy products, and lean protein. Do not eat a lot of foods that are high in solid fats, added sugars, or sodium.  Maintain a healthy weight Body mass index (BMI) is used to identify weight problems. It estimates body fat based on height and weight. Your health care provider can help determineyour BMI and help you achieve or maintain a healthy weight. Get regular exercise Get regular exercise. This is one of the most important things you can do for your health. Most adults should: Exercise for at least 150 minutes each week. The exercise should increase your heart rate and make you sweat (moderate-intensity exercise). Do strengthening exercises at least twice a week. This is in addition to the moderate-intensity exercise. Spend less time sitting. Even light physical activity can be beneficial. Watch cholesterol and blood lipids Have your blood tested for lipids and cholesterol at 65 years of age, then havethis test every 5 years. Have your cholesterol levels checked more often if: Your lipid or cholesterol levels are high. You are older than 65 years of age. You are at high risk for heart disease. What should I know about cancer screening? Depending on your health history and family history, you may need to have cancer screening at various ages. This may include screening for: Breast cancer. Cervical cancer. Colorectal cancer. Skin cancer. Lung cancer. What should I know about heart disease, diabetes, and high blood pressure? Blood pressure and heart  disease High blood pressure causes heart disease and increases the risk of stroke. This is more likely to develop in people who have high blood pressure readings, are of African descent, or are overweight. Have your blood pressure checked: Every 3-5 years if you are 18-39 years of age. Every year if you are 40 years old or older. Diabetes Have regular diabetes screenings. This checks your fasting blood sugar level. Have the screening done: Once every three years after age 40 if you are at a normal weight and have a low risk for diabetes. More often and at a younger age if you are overweight or have a high risk for diabetes. What should I know about preventing infection? Hepatitis B If you have a higher risk for hepatitis B, you should be screened for this virus. Talk with your health care provider to find out if you are at risk forhepatitis B infection. Hepatitis C Testing is recommended for: Everyone born from 1945 through 1965. Anyone with known risk factors for hepatitis C. Sexually transmitted infections (STIs) Get screened for STIs, including gonorrhea and chlamydia, if: You are sexually active and are younger than 65 years of age. You are older than 65 years of age and your health care provider tells you that you are at risk for this type of infection. Your sexual activity has changed since you were last screened, and you are at increased risk for chlamydia or gonorrhea. Ask your health care provider if you are at risk. Ask your health care provider about whether you are at high risk for HIV. Your health care provider may recommend a prescription medicine to help   prevent HIV infection. If you choose to take medicine to prevent HIV, you should first get tested for HIV. You should then be tested every 3 months for as long as you are taking the medicine. Pregnancy If you are about to stop having your period (premenopausal) and you may become pregnant, seek counseling before you get  pregnant. Take 400 to 800 micrograms (mcg) of folic acid every day if you become pregnant. Ask for birth control (contraception) if you want to prevent pregnancy. Osteoporosis and menopause Osteoporosis is a disease in which the bones lose minerals and strength with aging. This can result in bone fractures. If you are 65 years old or older, or if you are at risk for osteoporosis and fractures, ask your health care provider if you should: Be screened for bone loss. Take a calcium or vitamin D supplement to lower your risk of fractures. Be given hormone replacement therapy (HRT) to treat symptoms of menopause. Follow these instructions at home: Lifestyle Do not use any products that contain nicotine or tobacco, such as cigarettes, e-cigarettes, and chewing tobacco. If you need help quitting, ask your health care provider. Do not use street drugs. Do not share needles. Ask your health care provider for help if you need support or information about quitting drugs. Alcohol use Do not drink alcohol if: Your health care provider tells you not to drink. You are pregnant, may be pregnant, or are planning to become pregnant. If you drink alcohol: Limit how much you use to 0-1 drink a day. Limit intake if you are breastfeeding. Be aware of how much alcohol is in your drink. In the [place] one drink equals one 12 oz bottle of beer (355 mL), one 5 oz glass of wine (148 mL), or one 1 oz glass of hard liquor (44 mL). General instructions Schedule regular health, dental, and eye exams. Stay current with your vaccines. Tell your health care provider if: You often feel depressed. You have ever been abused or do not feel safe at home. Summary Adopting a healthy lifestyle and getting preventive care are important in promoting health and wellness. Follow your health care provider's instructions about healthy diet, exercising, and getting tested or screened for diseases. Follow your health care provider's  instructions on monitoring your cholesterol and blood pressure. This information is not intended to replace advice given to you by your health care provider. Make sure you discuss any questions you have with your healthcare provider. Document Revised: 07/17/2018 Document Reviewed: 07/17/2018 Elsevier Patient Education  2022 [employer]  

## 2021-02-04 ENCOUNTER — Other Ambulatory Visit: Payer: Self-pay | Admitting: Family Medicine

## 2021-02-04 DIAGNOSIS — E785 Hyperlipidemia, unspecified: Secondary | ICD-10-CM

## 2021-02-04 LAB — CBC WITH DIFFERENTIAL/PLATELET
Basophils Absolute: 0.1 10*3/uL (ref 0.0–0.2)
Basos: 1 %
EOS (ABSOLUTE): 0.2 10*3/uL (ref 0.0–0.4)
Eos: 2 %
Hematocrit: 38.9 % (ref 34.0–46.6)
Hemoglobin: 13.1 g/dL (ref 11.1–15.9)
Immature Grans (Abs): 0.1 10*3/uL (ref 0.0–0.1)
Immature Granulocytes: 1 %
Lymphocytes Absolute: 1.5 10*3/uL (ref 0.7–3.1)
Lymphs: 18 %
MCH: 31 pg (ref 26.6–33.0)
MCHC: 33.7 g/dL (ref 31.5–35.7)
MCV: 92 fL (ref 79–97)
Monocytes Absolute: 0.6 10*3/uL (ref 0.1–0.9)
Monocytes: 7 %
Neutrophils Absolute: 6.1 10*3/uL (ref 1.4–7.0)
Neutrophils: 71 %
Platelets: 400 10*3/uL (ref 150–450)
RBC: 4.22 x10E6/uL (ref 3.77–5.28)
RDW: 12.1 % (ref 11.7–15.4)
WBC: 8.4 10*3/uL (ref 3.4–10.8)

## 2021-02-04 LAB — CMP14+EGFR
ALT: 12 IU/L (ref 0–32)
AST: 15 IU/L (ref 0–40)
Albumin/Globulin Ratio: 1.9 (ref 1.2–2.2)
Albumin: 4.6 g/dL (ref 3.8–4.8)
Alkaline Phosphatase: 75 IU/L (ref 44–121)
BUN/Creatinine Ratio: 25 (ref 12–28)
BUN: 32 mg/dL — ABNORMAL HIGH (ref 8–27)
Bilirubin Total: <0.2 mg/dL (ref 0.0–1.2)
CO2: 20 mmol/L (ref 20–29)
Calcium: 10 mg/dL (ref 8.7–10.3)
Chloride: 94 mmol/L — ABNORMAL LOW (ref 96–106)
Creatinine, Ser: 1.28 mg/dL — ABNORMAL HIGH (ref 0.57–1.00)
Globulin, Total: 2.4 g/dL (ref 1.5–4.5)
Glucose: 122 mg/dL — ABNORMAL HIGH (ref 65–99)
Potassium: 4.7 mmol/L (ref 3.5–5.2)
Sodium: 132 mmol/L — ABNORMAL LOW (ref 134–144)
Total Protein: 7 g/dL (ref 6.0–8.5)
eGFR: 47 mL/min/{1.73_m2} — ABNORMAL LOW (ref >59)

## 2021-02-04 LAB — LIPID PANEL
Chol/HDL Ratio: 4.5 ratio — ABNORMAL HIGH (ref 0.0–4.4)
Cholesterol, Total: 261 mg/dL — ABNORMAL HIGH (ref 100–199)
HDL: 58 mg/dL (ref >39)
LDL Chol Calc (NIH): 165 mg/dL — ABNORMAL HIGH (ref 0–99)
Triglycerides: 207 mg/dL — ABNORMAL HIGH (ref 0–149)
VLDL Cholesterol Cal: 38 mg/dL (ref 5–40)

## 2021-02-04 MED ORDER — ATORVASTATIN CALCIUM 20 MG PO TABS: 20.0000 mg | ORAL_TABLET | Freq: Every day | ORAL | 3 refills | Status: DC

## 2021-02-04 MED ORDER — ATORVASTATIN CALCIUM 40 MG PO TABS: 40.0000 mg | ORAL_TABLET | Freq: Every day | ORAL | 0 refills | Status: DC

## 2021-02-10 LAB — CYTOLOGY - PAP
Comment: NEGATIVE
Diagnosis: UNDETERMINED — AB
High risk HPV: NEGATIVE

## 2021-04-05 ENCOUNTER — Telehealth: Payer: Self-pay | Admitting: Family Medicine

## 2021-04-06 NOTE — Telephone Encounter (Signed)
I spoke to pt and she says Kelsey Porter is out of the Ozempic '2mg'$  right now. She is going to call [facility] to see if they have it in stock and call back to let us know so we can send the rx into them if it is in stock.

## 2021-04-25 ENCOUNTER — Telehealth: Payer: Self-pay | Admitting: Family Medicine

## 2021-04-27 ENCOUNTER — Other Ambulatory Visit: Payer: Self-pay | Admitting: Family Medicine

## 2021-04-27 DIAGNOSIS — E1165 Type 2 diabetes mellitus with hyperglycemia: Secondary | ICD-10-CM

## 2021-04-27 DIAGNOSIS — E1169 Type 2 diabetes mellitus with other specified complication: Secondary | ICD-10-CM

## 2021-04-27 DIAGNOSIS — I152 Hypertension secondary to endocrine disorders: Secondary | ICD-10-CM

## 2021-04-27 DIAGNOSIS — E785 Hyperlipidemia, unspecified: Secondary | ICD-10-CM

## 2021-04-27 DIAGNOSIS — E1159 Type 2 diabetes mellitus with other circulatory complications: Secondary | ICD-10-CM

## 2021-04-27 NOTE — Telephone Encounter (Signed)
Lmtcb.

## 2021-05-04 ENCOUNTER — Other Ambulatory Visit: Payer: Self-pay

## 2021-05-04 ENCOUNTER — Other Ambulatory Visit: Payer: BC Managed Care – PPO

## 2021-05-04 DIAGNOSIS — E1169 Type 2 diabetes mellitus with other specified complication: Secondary | ICD-10-CM

## 2021-05-04 DIAGNOSIS — E1159 Type 2 diabetes mellitus with other circulatory complications: Secondary | ICD-10-CM

## 2021-05-04 DIAGNOSIS — I152 Hypertension secondary to endocrine disorders: Secondary | ICD-10-CM

## 2021-05-04 DIAGNOSIS — E1165 Type 2 diabetes mellitus with hyperglycemia: Secondary | ICD-10-CM

## 2021-05-04 LAB — CMP14+EGFR
ALT: 14 IU/L (ref 0–32)
AST: 17 IU/L (ref 0–40)
Albumin/Globulin Ratio: 1.9 (ref 1.2–2.2)
Albumin: 4.6 g/dL (ref 3.8–4.8)
Alkaline Phosphatase: 82 IU/L (ref 44–121)
BUN/Creatinine Ratio: 16 (ref 12–28)
BUN: 17 mg/dL (ref 8–27)
Bilirubin Total: 0.2 mg/dL (ref 0.0–1.2)
CO2: 18 mmol/L — ABNORMAL LOW (ref 20–29)
Calcium: 9.9 mg/dL (ref 8.7–10.3)
Chloride: 98 mmol/L (ref 96–106)
Creatinine, Ser: 1.05 mg/dL — ABNORMAL HIGH (ref 0.57–1.00)
Globulin, Total: 2.4 g/dL (ref 1.5–4.5)
Glucose: 103 mg/dL — ABNORMAL HIGH (ref 70–99)
Potassium: 4.6 mmol/L (ref 3.5–5.2)
Sodium: 136 mmol/L (ref 134–144)
Total Protein: 7 g/dL (ref 6.0–8.5)
eGFR: 59 mL/min/{1.73_m2} — ABNORMAL LOW (ref >59)

## 2021-05-04 LAB — CBC WITH DIFFERENTIAL/PLATELET
Basophils Absolute: 0.1 10*3/uL (ref 0.0–0.2)
Basos: 1 %
EOS (ABSOLUTE): 0.1 10*3/uL (ref 0.0–0.4)
Eos: 1 %
Hematocrit: 38.9 % (ref 34.0–46.6)
Hemoglobin: 13.1 g/dL (ref 11.1–15.9)
Immature Grans (Abs): 0.1 10*3/uL (ref 0.0–0.1)
Immature Granulocytes: 1 %
Lymphocytes Absolute: 1.4 10*3/uL (ref 0.7–3.1)
Lymphs: 15 %
MCH: 30.8 pg (ref 26.6–33.0)
MCHC: 33.7 g/dL (ref 31.5–35.7)
MCV: 92 fL (ref 79–97)
Monocytes Absolute: 0.7 10*3/uL (ref 0.1–0.9)
Monocytes: 7 %
Neutrophils Absolute: 7.3 10*3/uL — ABNORMAL HIGH (ref 1.4–7.0)
Neutrophils: 75 %
Platelets: 397 10*3/uL (ref 150–450)
RBC: 4.25 x10E6/uL (ref 3.77–5.28)
RDW: 12.4 % (ref 11.7–15.4)
WBC: 9.6 10*3/uL (ref 3.4–10.8)

## 2021-05-04 LAB — LIPID PANEL
Chol/HDL Ratio: 2.6 ratio (ref 0.0–4.4)
Cholesterol, Total: 169 mg/dL (ref 100–199)
HDL: 64 mg/dL (ref >39)
LDL Chol Calc (NIH): 86 mg/dL (ref 0–99)
Triglycerides: 107 mg/dL (ref 0–149)
VLDL Cholesterol Cal: 19 mg/dL (ref 5–40)

## 2021-05-04 LAB — BAYER DCA HB A1C WAIVED: HB A1C (BAYER DCA - WAIVED): 6 % — ABNORMAL HIGH (ref 4.8–5.6)

## 2021-05-04 NOTE — Telephone Encounter (Signed)
Unable to reach patient, encounter closed °

## 2021-05-05 ENCOUNTER — Ambulatory Visit (INDEPENDENT_AMBULATORY_CARE_PROVIDER_SITE_OTHER): Payer: BC Managed Care – PPO | Admitting: Family Medicine

## 2021-05-05 ENCOUNTER — Encounter: Payer: Self-pay | Admitting: Family Medicine

## 2021-05-05 VITALS — BP 124/68 | HR 107 | Temp 97.3°F | Ht 63.0 in | Wt 154.1 lb

## 2021-05-05 DIAGNOSIS — Z23 Encounter for immunization: Secondary | ICD-10-CM | POA: Diagnosis not present

## 2021-05-05 DIAGNOSIS — F331 Major depressive disorder, recurrent, moderate: Secondary | ICD-10-CM

## 2021-05-05 DIAGNOSIS — E663 Overweight: Secondary | ICD-10-CM | POA: Diagnosis not present

## 2021-05-05 DIAGNOSIS — E1165 Type 2 diabetes mellitus with hyperglycemia: Secondary | ICD-10-CM | POA: Diagnosis not present

## 2021-05-05 DIAGNOSIS — E785 Hyperlipidemia, unspecified: Secondary | ICD-10-CM

## 2021-05-05 DIAGNOSIS — I152 Hypertension secondary to endocrine disorders: Secondary | ICD-10-CM

## 2021-05-05 DIAGNOSIS — E1169 Type 2 diabetes mellitus with other specified complication: Secondary | ICD-10-CM | POA: Diagnosis not present

## 2021-05-05 DIAGNOSIS — N1831 Chronic kidney disease, stage 3a: Secondary | ICD-10-CM | POA: Insufficient documentation

## 2021-05-05 DIAGNOSIS — Z6827 Body mass index (BMI) 27.0-27.9, adult: Secondary | ICD-10-CM

## 2021-05-05 DIAGNOSIS — E1159 Type 2 diabetes mellitus with other circulatory complications: Secondary | ICD-10-CM

## 2021-05-05 MED ORDER — LISINOPRIL 10 MG PO TABS: 10.0000 mg | ORAL_TABLET | Freq: Every day | ORAL | 3 refills | Status: DC

## 2021-05-05 MED ORDER — AMLODIPINE BESYLATE 10 MG PO TABS: 10.0000 mg | ORAL_TABLET | Freq: Every day | ORAL | 3 refills | Status: DC

## 2021-05-05 MED ORDER — ATORVASTATIN CALCIUM 20 MG PO TABS: 20.0000 mg | ORAL_TABLET | Freq: Every day | ORAL | 3 refills | Status: DC

## 2021-05-05 MED ORDER — FLUOXETINE HCL 40 MG PO CAPS: 40.0000 mg | ORAL_CAPSULE | Freq: Every day | ORAL | 3 refills | Status: DC

## 2021-05-05 NOTE — Patient Instructions (Signed)

## 2021-05-05 NOTE — Progress Notes (Signed)
Established Patient Office Visit  Subjective:  Patient ID: Kelsey Porter, female    DOB: Jan 06, 1956  Age: 65 y.o. MRN: [id number]  CC:  Chief Complaint  Patient presents with   Diabetes   Hyperlipidemia   Hypertension   Medical Management of Chronic Issues    HPI Kelsey Porter presents for chronic follow up. She reports that she is doing well. She has lost another 12 pounds. She has been eating a heart healthy well balanced diet. She is active throughout the day but is not exercising as much as before. She reports that she feels great and has never felt this good before or had this much energy. She is checking her BP at home. Her BP was 124/68 this morning. Her fasting blood sugars range from 102-118. She would really love to come off metformin. That is a goal that she set for herself. She is doing well on ozempic.    Depression screen [facility] 2/9 05/05/2021 02/03/2021 11/01/2020  Decreased Interest 0 1 1  Down, Depressed, Hopeless 0 1 1  PHQ - 2 Score 0 2 2  Altered sleeping 1 1 0  Tired, decreased energy 0 1 1  Change in appetite 0 0 2  Feeling bad or failure about yourself  0 0 0  Trouble concentrating 0 0 0  Moving slowly or fidgety/restless 0 0 0  Suicidal thoughts 0 0 0  PHQ-9 Score _0 Difficult doing work/chores Not difficult at all Not difficult at all Not difficult at all   GAD 7 : Generalized Anxiety Score 05/05/2021 02/03/2021  Nervous, Anxious, on Edge 0 1  Control/stop worrying 0 1  Worry too much - different things 0 1  Trouble relaxing 0 1  Restless 0 0  Easily annoyed or irritable 0 0  Afraid - awful might happen 0 0  Total GAD 7 Score 0 4  Anxiety Difficulty Not difficult at all Not difficult at all      Past Medical History:  Diagnosis Date   Allergy    Arthritis    Depression    Diabetes mellitus without complication ([place])    History of Bell's palsy    Hyperlipidemia    Hypertension     Past Surgical History:  Procedure Laterality  Date   BACK SURGERY  2002   COLON SURGERY     had part of her colon removed   COLONOSCOPY WITH PROPOFOL N/A 11/29/2020   Procedure: COLONOSCOPY WITH PROPOFOL;  Surgeon: Eloise Harman, DO;  [facility];  Service: Endoscopy;  Laterality: N/A;  AM-diabetic   POLYPECTOMY  11/29/2020   Procedure: POLYPECTOMY;  Surgeon: Eloise Harman, DO;  Location: [facility];  Service: Endoscopy;;    Family History  Problem Relation Age of Onset   Alcohol abuse Mother    Arthritis Mother    COPD Mother    Diabetes Mother    Heart disease Mother    Hyperlipidemia Mother    Hypertension Mother    Kidney disease Mother    Cancer Father        lung   Diabetes Sister    Arthritis Maternal Grandmother    Diabetes Maternal Grandmother    Stroke Maternal Grandmother    Anxiety disorder Brother    Diabetes Brother    Colon cancer Neg Hx     Social History   Socioeconomic History   Marital status: Married    Spouse name: Legrand Como   Number of children: 2  Years of education: 14   Highest education level: Associate degree: occupational, Hotel manager, or vocational program  Occupational History   Occupation: Retired  Tobacco Use   Smoking status: Every Day    Packs/day: 1.00    Years: 40.00    Pack years: 40.00    Types: Cigarettes   Smokeless tobacco: Never  Vaping Use   Vaping Use: Never used  Substance and Sexual Activity   Alcohol use: Never   Drug use: Never   Sexual activity: Yes    Birth control/protection: Post-menopausal  Other Topics Concern   Not on file  Social History Narrative   Not on file   Social Determinants of Health   Financial Resource Strain: Not on file  Food Insecurity: Not on file  Transportation Needs: Not on file  Physical Activity: Not on file  Stress: Not on file  Social Connections: Not on file  Intimate Partner Violence: Not on file    Outpatient Medications Prior to Visit  Medication Sig Dispense Refill   amLODipine (NORVASC) 10  MG tablet Take 1 tablet (10 mg total) by mouth daily. 90 tablet 2   atorvastatin (LIPITOR) 20 MG tablet Take 1 tablet (20 mg total) by mouth daily. 90 tablet 3   EPINEPHrine 0.3 mg/0.3 mL IJ SOAJ injection Inject 0.3 mg into the muscle as needed for anaphylaxis. 1 each 2   FLUoxetine (PROZAC) 40 MG capsule Take 1 capsule (40 mg total) by mouth daily. 90 capsule 0   lisinopril (ZESTRIL) 10 MG tablet Take 1 tablet (10 mg total) by mouth daily. 90 tablet 3   Melatonin 10 MG TABS Take 10 mg by mouth at bedtime.     metFORMIN (GLUCOPHAGE) 1000 MG tablet Take 1 tablet (1,000 mg total) by mouth 2 (two) times daily. 180 tablet 3   Omega-3 Fatty Acids (OMEGA 3 PO) Take by mouth.     Semaglutide, 2 MG/DOSE, (OZEMPIC, 2 MG/DOSE,) 8 MG/3ML SOPN Inject 2 mg into the skin once a week. 9 mL 3   Turmeric 500 MG TABS Take 500 mg by mouth at bedtime.     hydrochlorothiazide (HYDRODIURIL) 50 MG tablet Take 1 tablet (50 mg total) by mouth daily. (Patient not taking: Reported on 05/05/2021) 90 tablet 3   meclizine (ANTIVERT) 25 MG tablet Take 1 tablet (25 mg total) by mouth 3 (three) times daily as needed for dizziness. 30 tablet 0   triamcinolone (KENALOG) 0.1 % Apply 1 application topically 2 (two) times daily. (Patient taking differently: Apply 1 application topically daily as needed (Rash).) 30 g 0   No facility-administered medications prior to visit.    Allergies  Allergen Reactions   Bee Venom Anaphylaxis   Penicillin G Rash    Reaction: 25 years ago    ROS Review of Systems Negative unless specially indicated above in HPI.    Objective:    Physical Exam Vitals and nursing note reviewed.  Constitutional:      General: She is not in acute distress.    Appearance: She is not ill-appearing, toxic-appearing or diaphoretic.  HENT:     Head: Normocephalic and atraumatic.  Cardiovascular:     Rate and Rhythm: Normal rate and regular rhythm.     Heart sounds: Normal heart sounds. No murmur  heard. Pulmonary:     Effort: Pulmonary effort is normal. No respiratory distress.     Breath sounds: Normal breath sounds.  Musculoskeletal:     Right lower leg: No edema.     Left  lower leg: No edema.  Skin:    General: Skin is warm and dry.  Neurological:     General: No focal deficit present.     Mental Status: She is alert and oriented to person, place, and time.  Psychiatric:        Mood and Affect: Mood normal.        Behavior: Behavior normal.    BP 124/68 Comment: at home reading per pt  Pulse (!) 107   Temp (!) 97.3 F (36.3 C) (Temporal)   Ht _0  (1.6 m)   Wt 154 lb 2 oz (69.9 kg)   BMI 27.30 kg/m  Wt Readings from Last 3 Encounters:  05/05/21 154 lb 2 oz (69.9 kg)  02/03/21 166 lb 8 oz (75.5 kg)  12/22/20 173 lb 6 oz (78.6 kg)     Health Maintenance Due  Topic Date Due   INFLUENZA VACCINE  03/07/2021    There are no preventive care reminders to display for this patient.  Lab Results  Component Value Date   TSH 2.250 08/04/2020   Lab Results  Component Value Date   WBC 9.6 05/04/2021   HGB 13.1 05/04/2021   HCT 38.9 05/04/2021   MCV 92 05/04/2021   PLT 397 05/04/2021   Lab Results  Component Value Date   NA 136 05/04/2021   K 4.6 05/04/2021   CO2 18 (L) 05/04/2021   GLUCOSE 103 (H) 05/04/2021   BUN 17 05/04/2021   CREATININE 1.05 (H) 05/04/2021   BILITOT 0.2 05/04/2021   ALKPHOS 82 05/04/2021   AST 17 05/04/2021   ALT 14 05/04/2021   PROT 7.0 05/04/2021   ALBUMIN 4.6 05/04/2021   CALCIUM 9.9 05/04/2021   ANIONGAP 14 11/26/2020   EGFR 59 (L) 05/04/2021   Lab Results  Component Value Date   CHOL 169 05/04/2021   Lab Results  Component Value Date   HDL 64 05/04/2021   Lab Results  Component Value Date   LDLCALC 86 05/04/2021   Lab Results  Component Value Date   TRIG 107 05/04/2021   Lab Results  Component Value Date   CHOLHDL 2.6 05/04/2021   Lab Results  Component Value Date   HGBA1C 6.0 (H) 05/04/2021       Assessment & Plan:   Dejia was seen today for diabetes, hyperlipidemia, hypertension and medical management of chronic issues.  Diagnoses and all orders for this visit:  Type 2 diabetes mellitus with hyperglycemia, without long-term current use of insulin ([place]) Well controlled. A1c is 6.0. Reminded to get eye exam. Has made significant lifestyle changes. Will discontinue metformin today. Continue ozempic.  -     atorvastatin (LIPITOR) 20 MG tablet; Take 1 tablet (20 mg total) by mouth daily. -     lisinopril (ZESTRIL) 10 MG tablet; Take 1 tablet (10 mg total) by mouth daily.  Hyperlipidemia associated with type 2 diabetes mellitus ([place]) Well controlled on current regimen with lipitor and diet.  -     atorvastatin (LIPITOR) 20 MG tablet; Take 1 tablet (20 mg total) by mouth daily.  Stage 3a chronic kidney disease (HCC) GFR has improved to 59.   Overweight with body mass index (BMI) of 27 to 27.9 in adult Has lost 12 pounds since last visit. Continue diet and exercise.   Hypertension associated with diabetes ([place]) Well controlled on current regimen.  -     amLODipine (NORVASC) 10 MG tablet; Take 1 tablet (10 mg total) by mouth daily. -  lisinopril (ZESTRIL) 10 MG tablet; Take 1 tablet (10 mg total) by mouth daily.  Moderate episode of recurrent major depressive disorder (HCC) Well controlled on current regimen.  -     FLUoxetine (PROZAC) 40 MG capsule; Take 1 capsule (40 mg total) by mouth daily.  Need for immunization against influenza -     Flu Vaccine QUAD 56moIM (Fluarix, Fluzone & Alfiuria Quad PF  Follow-up: Return in about 6 months (around 11/14/2021) for chronic follow up.   The patient indicates understanding of these issues and agrees with the plan.  TGwenlyn Perking FNP

## 2021-05-16 ENCOUNTER — Telehealth: Payer: Self-pay | Admitting: Family Medicine

## 2021-05-16 DIAGNOSIS — N1831 Chronic kidney disease, stage 3a: Secondary | ICD-10-CM

## 2021-05-16 DIAGNOSIS — E1165 Type 2 diabetes mellitus with hyperglycemia: Secondary | ICD-10-CM

## 2021-05-16 MED ORDER — DAPAGLIFLOZIN PROPANEDIOL 10 MG PO TABS: 10.0000 mg | ORAL_TABLET | Freq: Every day | ORAL | 0 refills | Status: DC

## 2021-05-16 NOTE — Telephone Encounter (Signed)
I have sent in farxiga 10 mg for her to start for diabetes. This will also help protect her kidneys. If this medications is too expensive, please let me know so that I can discuss affordability with Almyra Free.

## 2021-05-16 NOTE — Telephone Encounter (Signed)
Patient is concerned about her blood sugar readings.  Her numbers have been between 136-180 the last couple of days.  Her fasting sugar yesterday was 173 and today it was 156.  She feels she needs another medication.  She is completely off Metformin at this time.

## 2021-05-16 NOTE — Telephone Encounter (Signed)
Pt aware of provider feedback and voiced understanding. 

## 2021-05-17 ENCOUNTER — Telehealth: Payer: Self-pay | Admitting: Family Medicine

## 2021-05-17 NOTE — Telephone Encounter (Signed)
Pt calling back to let us know that the [place] that was called in to [facility] will be $2,000. She would like to know if there is anything else that she can try. Please call back and advise.

## 2021-05-18 ENCOUNTER — Other Ambulatory Visit: Payer: Self-pay | Admitting: Family Medicine

## 2021-05-18 ENCOUNTER — Encounter: Payer: Self-pay | Admitting: *Deleted

## 2021-05-18 ENCOUNTER — Telehealth: Payer: Self-pay | Admitting: Family Medicine

## 2021-05-18 DIAGNOSIS — E1165 Type 2 diabetes mellitus with hyperglycemia: Secondary | ICD-10-CM

## 2021-05-18 MED ORDER — METFORMIN HCL ER 500 MG PO TB24: 1000.0000 mg | ORAL_TABLET | Freq: Every day | ORAL | 2 refills | Status: DC

## 2021-05-18 MED ORDER — EMPAGLIFLOZIN 10 MG PO TABS: 10.0000 mg | ORAL_TABLET | Freq: Every day | ORAL | 1 refills | Status: DC

## 2021-05-18 MED ORDER — DAPAGLIFLOZIN PROPANEDIOL 10 MG PO TABS: 10.0000 mg | ORAL_TABLET | Freq: Every day | ORAL | 3 refills | Status: DC

## 2021-05-18 NOTE — Telephone Encounter (Signed)
Kelsey Porter sent in link for a $0 copay card for [name]. Copy link below and print for patient please.   [email or web address]

## 2021-05-18 NOTE — Telephone Encounter (Signed)
Do we have samples of Farxiga or Jardiance to give to patient? Please schedule her with Almyra Free for patient assistance to help with cost for one of these.

## 2021-05-18 NOTE — Telephone Encounter (Signed)
Please see other telephone encounter, pt doesn't have insurance so metformin was sent in.

## 2021-05-18 NOTE — Addendum Note (Signed)
Addended by: Gwenlyn Perking on: 05/18/2021 02:30 PM   Modules accepted: Orders

## 2021-05-18 NOTE — Telephone Encounter (Signed)
Pt aware or provider feedback and voiced understanding.

## 2021-05-18 NOTE — Telephone Encounter (Signed)
I have sent in jardiance 10 mg instead.

## 2021-05-18 NOTE — Telephone Encounter (Signed)
Patient does not have insurance right now, so copay card will not work. I have send in metfromin for her to restart while she does not have insurance as it is the most inexpensive option. It is on the $4 list at [facility]. Will try 1000 mg just once daily for now. When she goes on medicare in December, we can switch her over to [name].

## 2021-05-18 NOTE — Addendum Note (Signed)
Addended by: Gwenlyn Perking on: 05/18/2021 03:07 PM   Modules accepted: Orders

## 2021-05-18 NOTE — Telephone Encounter (Signed)
Pt aware Jardiance sent into pharmacy.

## 2021-05-18 NOTE — Telephone Encounter (Signed)
There is a $0 copay card on line if we could print for patient!  [email or web address]  Copy and paste link above  Thanks!

## 2021-05-19 ENCOUNTER — Emergency Department (HOSPITAL_COMMUNITY)
Admission: EM | Admit: 2021-05-19 | Discharge: 2021-05-19 | Disposition: A | Discharge status: Home / Self Care | Payer: BC Managed Care – PPO | Attending: Emergency Medicine | Admitting: Emergency Medicine

## 2021-05-19 ENCOUNTER — Other Ambulatory Visit: Payer: Self-pay

## 2021-05-19 DIAGNOSIS — Z87892 Personal history of anaphylaxis: Secondary | ICD-10-CM

## 2021-05-19 DIAGNOSIS — Z794 Long term (current) use of insulin: Secondary | ICD-10-CM | POA: Insufficient documentation

## 2021-05-19 DIAGNOSIS — I129 Hypertensive chronic kidney disease with stage 1 through stage 4 chronic kidney disease, or unspecified chronic kidney disease: Secondary | ICD-10-CM | POA: Insufficient documentation

## 2021-05-19 DIAGNOSIS — T782XXA Anaphylactic shock, unspecified, initial encounter: Secondary | ICD-10-CM

## 2021-05-19 DIAGNOSIS — N1831 Chronic kidney disease, stage 3a: Secondary | ICD-10-CM | POA: Insufficient documentation

## 2021-05-19 DIAGNOSIS — T63441A Toxic effect of venom of bees, accidental (unintentional), initial encounter: Secondary | ICD-10-CM | POA: Insufficient documentation

## 2021-05-19 DIAGNOSIS — Z7984 Long term (current) use of oral hypoglycemic drugs: Secondary | ICD-10-CM | POA: Insufficient documentation

## 2021-05-19 DIAGNOSIS — Z91030 Bee allergy status: Secondary | ICD-10-CM

## 2021-05-19 DIAGNOSIS — Z79899 Other long term (current) drug therapy: Secondary | ICD-10-CM | POA: Insufficient documentation

## 2021-05-19 DIAGNOSIS — F17210 Nicotine dependence, cigarettes, uncomplicated: Secondary | ICD-10-CM | POA: Insufficient documentation

## 2021-05-19 DIAGNOSIS — E1122 Type 2 diabetes mellitus with diabetic chronic kidney disease: Secondary | ICD-10-CM | POA: Insufficient documentation

## 2021-05-19 LAB — BASIC METABOLIC PANEL
Anion gap: 9 (ref 5–15)
BUN: 27 mg/dL — ABNORMAL HIGH (ref 8–23)
CO2: 22 mmol/L (ref 22–32)
Calcium: 9.1 mg/dL (ref 8.9–10.3)
Chloride: 104 mmol/L (ref 98–111)
Creatinine, Ser: 1.1 mg/dL — ABNORMAL HIGH (ref 0.44–1.00)
GFR, Estimated: 56 mL/min — ABNORMAL LOW (ref >60)
Glucose, Bld: 156 mg/dL — ABNORMAL HIGH (ref 70–99)
Potassium: 3.9 mmol/L (ref 3.5–5.1)
Sodium: 135 mmol/L (ref 135–145)

## 2021-05-19 LAB — CBC
HCT: 40.4 % (ref 36.0–46.0)
Hemoglobin: 13.1 g/dL (ref 12.0–15.0)
MCH: 31.1 pg (ref 26.0–34.0)
MCHC: 32.4 g/dL (ref 30.0–36.0)
MCV: 96 fL (ref 80.0–100.0)
Platelets: 375 10*3/uL (ref 150–400)
RBC: 4.21 MIL/uL (ref 3.87–5.11)
RDW: 12.7 % (ref 11.5–15.5)
WBC: 9.4 10*3/uL (ref 4.0–10.5)
nRBC: 0 % (ref 0.0–0.2)

## 2021-05-19 MED ORDER — EPINEPHRINE 0.3 MG/0.3ML IJ SOAJ: 0.3000 mg | Freq: Once | INTRAMUSCULAR | Status: AC

## 2021-05-19 MED ORDER — FAMOTIDINE IN NACL 20-0.9 MG/50ML-% IV SOLN
20.0000 mg | Freq: Once | INTRAVENOUS | Status: AC
Start: 2021-05-19 — End: 2021-05-19
  Administered 2021-05-19: 20 mg via INTRAVENOUS
  Filled 2021-05-19: qty 50

## 2021-05-19 MED ORDER — EPINEPHRINE 0.3 MG/0.3ML IJ SOAJ
INTRAMUSCULAR | Status: AC
  Administered 2021-05-19: 0.3 mg via INTRAMUSCULAR
  Filled 2021-05-19: qty 0.3

## 2021-05-19 MED ORDER — EPINEPHRINE 0.3 MG/0.3ML IJ SOAJ: 0.3000 mg | INTRAMUSCULAR | 2 refills | Status: DC | PRN

## 2021-05-19 MED ORDER — PREDNISONE 10 MG (21) PO TBPK: ORAL_TABLET | Freq: Every day | ORAL | 0 refills | Status: DC

## 2021-05-19 MED ORDER — SODIUM CHLORIDE 0.9 % IV BOLUS
1000.0000 mL | Freq: Once | INTRAVENOUS | Status: AC
Start: 2021-05-19 — End: 2021-05-19
  Administered 2021-05-19: 1000 mL via INTRAVENOUS

## 2021-05-19 MED ORDER — METHYLPREDNISOLONE SODIUM SUCC 125 MG IJ SOLR
125.0000 mg | Freq: Once | INTRAMUSCULAR | Status: AC
Start: 2021-05-19 — End: 2021-05-19
  Administered 2021-05-19: 125 mg via INTRAVENOUS
  Filled 2021-05-19: qty 2

## 2021-05-19 MED ORDER — DIPHENHYDRAMINE HCL 50 MG/ML IJ SOLN
25.0000 mg | Freq: Once | INTRAMUSCULAR | Status: AC
Start: 2021-05-19 — End: 2021-05-19
  Administered 2021-05-19: 25 mg via INTRAVENOUS
  Filled 2021-05-19: qty 1

## 2021-05-19 MED ORDER — SODIUM CHLORIDE 0.9 % IV SOLN
INTRAVENOUS | Status: DC
Start: 2021-05-19 — End: 2021-05-19

## 2021-05-19 NOTE — ED Provider Notes (Signed)
[facility] EMERGENCY DEPARTMENT Provider Note   CSN: [id number] Arrival date & time: 05/19/21  0901     History Chief Complaint  Patient presents with   Oral Swelling    Kelsey Porter is a 65 y.o. female.  Pt presents to the ED today with tongue swelling.  Pt said she has a hx of a wasp/bee allergy.  She was stung by 2 wasps in 2016 and required an ICU stay for anaphylaxis.  She was drinking her coffee outside around 0600 today and there was a bee in her coffee.  She was stung on the tongue.  She took 25 mg benadryl and gave herself 1 dose of her epi pen.  Tongue swelling worsened, so she came into the ED.        Past Medical History:  Diagnosis Date   Allergy    Arthritis    Depression    Diabetes mellitus without complication ([place])    History of Bell's palsy    Hyperlipidemia    Hypertension     Patient Active Problem List   Diagnosis Date Noted   Stage 3a chronic kidney disease ([place]) 05/05/2021   Orthostatic hypotension 12/22/2020   Encounter for screening colonoscopy 11/02/2020   Hyperlipidemia associated with type 2 diabetes mellitus ([place]) 05/17/2019   Bell's palsy 08/10/2017   Type 2 diabetes mellitus with hyperglycemia, without long-term current use of insulin ([place]) 04/06/2017   PTSD (post-traumatic stress disorder) 04/03/2017   Hypertension associated with diabetes ([place]) 04/03/2017   Major depressive disorder with current active episode 04/03/2017   Vasomotor rhinitis 03/26/2015   Allergy to bee sting 02/28/2015    Past Surgical History:  Procedure Laterality Date   BACK SURGERY  2002   COLON SURGERY     had part of her colon removed   COLONOSCOPY WITH PROPOFOL N/A 11/29/2020   Procedure: COLONOSCOPY WITH PROPOFOL;  Surgeon: Eloise Harman, DO;  [facility];  Service: Endoscopy;  Laterality: N/A;  AM-diabetic   POLYPECTOMY  11/29/2020   Procedure: POLYPECTOMY;  Surgeon: Eloise Harman, DO;  [facility]: [facility];  Service:  Endoscopy;;     OB History   No obstetric history on file.     Family History  Problem Relation Age of Onset   Alcohol abuse Mother    Arthritis Mother    COPD Mother    Diabetes Mother    Heart disease Mother    Hyperlipidemia Mother    Hypertension Mother    Kidney disease Mother    Cancer Father        lung   Diabetes Sister    Arthritis Maternal Grandmother    Diabetes Maternal Grandmother    Stroke Maternal Grandmother    Anxiety disorder Brother    Diabetes Brother    Colon cancer Neg Hx     Social History   Tobacco Use   Smoking status: Every Day    Packs/day: 1.00    Years: 40.00    Pack years: 40.00    Types: Cigarettes   Smokeless tobacco: Never  Vaping Use   Vaping Use: Never used  Substance Use Topics   Alcohol use: Never   Drug use: Never    Home Medications Prior to Admission medications   Medication Sig Start Date End Date Taking? Authorizing Provider  amLODipine (NORVASC) 10 MG tablet Take 1 tablet (10 mg total) by mouth daily. 05/05/21  Yes Gwenlyn Perking, FNP  atorvastatin (LIPITOR) 20 MG tablet Take 1  tablet (20 mg total) by mouth daily. 05/05/21  Yes Gwenlyn Perking, FNP  FLUoxetine (PROZAC) 40 MG capsule Take 1 capsule (40 mg total) by mouth daily. 05/05/21  Yes Gwenlyn Perking, FNP  lisinopril (ZESTRIL) 10 MG tablet Take 1 tablet (10 mg total) by mouth daily. 05/05/21  Yes Gwenlyn Perking, FNP  Melatonin 10 MG TABS Take 10 mg by mouth at bedtime.   Yes [provider]  metFORMIN (GLUCOPHAGE XR) 500 MG 24 hr tablet Take 2 tablets (1,000 mg total) by mouth daily with breakfast. 05/18/21  Yes Gwenlyn Perking, FNP  Omega-3 Fatty Acids (OMEGA 3 PO) Take 1 capsule by mouth at bedtime.   Yes [provider]  predniSONE (STERAPRED UNI-PAK 21 TAB) 10 MG (21) TBPK tablet Take by mouth daily. Take 6 tabs by mouth daily  for 2 days, then 5 tabs for 2 days, then 4 tabs for 2 days, then 3 tabs for 2 days, 2 tabs for 2 days, then  1 tab by mouth daily for 2 days 05/19/21  Yes Isla Pence, MD  Semaglutide, 2 MG/DOSE, (OZEMPIC, 2 MG/DOSE,) 8 MG/3ML SOPN Inject 2 mg into the skin once a week. 12/29/20  Yes Gwenlyn Perking, FNP  Turmeric 500 MG TABS Take 500 mg by mouth daily.   Yes [provider]  dapagliflozin propanediol (FARXIGA) 10 MG TABS tablet Take 1 tablet (10 mg total) by mouth daily. Patient not taking: No sig reported 05/18/21   Gwenlyn Perking, FNP  EPINEPHrine 0.3 mg/0.3 mL IJ SOAJ injection Inject 0.3 mg into the muscle as needed for anaphylaxis. 05/19/21   Isla Pence, MD    Allergies    Bee venom and Penicillin g  Review of Systems   Review of Systems  HENT:  Positive for facial swelling.        Tongue swelling  All other systems reviewed and are negative.  Physical Exam Updated Vital Signs BP 135/77   Pulse 99   Temp 97.9 F (36.6 C) (Oral)   Resp 17   Ht 5\' 3"  (1.6 m)   Wt 69.9 kg   SpO2 97%   BMI 27.30 kg/m   Physical Exam Vitals and nursing note reviewed.  Constitutional:      General: She is in acute distress.  HENT:     Head: Normocephalic and atraumatic.     Right Ear: External ear normal.     Left Ear: External ear normal.     Nose: Nose normal.     Mouth/Throat:     Comments: Tongue and lip swelling Eyes:     Extraocular Movements: Extraocular movements intact.     Conjunctiva/sclera: Conjunctivae normal.     Pupils: Pupils are equal, round, and reactive to light.  Cardiovascular:     Rate and Rhythm: Normal rate and regular rhythm.     Pulses: Normal pulses.     Heart sounds: Normal heart sounds.  Pulmonary:     Effort: Pulmonary effort is normal.     Breath sounds: Normal breath sounds.  Abdominal:     General: Abdomen is flat. Bowel sounds are normal.     Palpations: Abdomen is soft.  Musculoskeletal:        General: Normal range of motion.     Cervical back: Normal range of motion and neck supple.  Skin:    General: Skin is warm.      Capillary Refill: Capillary refill takes less than 2 seconds.  Neurological:  General: No focal deficit present.     Mental Status: She is alert and oriented to person, place, and time.  Psychiatric:        Mood and Affect: Mood normal.        Behavior: Behavior normal.    ED Results / Procedures / Treatments   Labs (all labs ordered are listed, but only abnormal results are displayed) Labs Reviewed  BASIC METABOLIC PANEL - Abnormal; Notable for the following components:      Result Value   Glucose, Bld 156 (*)    BUN 27 (*)    Creatinine, Ser 1.10 (*)    GFR, Estimated 56 (*)    All other components within normal limits  CBC    EKG None  Radiology No results found.  Procedures Procedures   Medications Ordered in ED Medications  sodium chloride 0.9 % bolus 1,000 mL (0 mLs Intravenous Stopped 05/19/21 1105)    And  0.9 %  sodium chloride infusion ( Intravenous New Bag/Given 05/19/21 1236)  diphenhydrAMINE (BENADRYL) injection 25 mg (25 mg Intravenous Given 05/19/21 0918)  methylPREDNISolone sodium succinate (SOLU-MEDROL) 125 mg/2 mL injection 125 mg (125 mg Intravenous Given 05/19/21 0917)  famotidine (PEPCID) IVPB 20 mg premix (0 mg Intravenous Stopped 05/19/21 0949)  EPINEPHrine (EPI-PEN) injection 0.3 mg (0.3 mg Intramuscular Given 05/19/21 0914)    ED Course  I have reviewed the triage vital signs and the nursing notes.  Pertinent labs & imaging results that were available during my care of the patient were reviewed by me and considered in my medical decision making (see chart for details).    MDM Rules/Calculators/A&P                           Pt given epi/solumedrol/pepcid/benadryl and ice.  She has been observed for several hours and is feeling much better.  Tongue is slightly swollen, but she is able to talk and swallow without problems.  Pt is stable for d/c.  Return if worse.  CRITICAL CARE Performed by: Isla Pence   Total critical care time:  30 minutes  Critical care time was exclusive of separately billable procedures and treating other patients.  Critical care was necessary to treat or prevent imminent or life-threatening deterioration.  Critical care was time spent personally by me on the following activities: development of treatment plan with patient and/or surrogate as well as nursing, discussions with consultants, evaluation of patient's response to treatment, examination of patient, obtaining history from patient or surrogate, ordering and performing treatments and interventions, ordering and review of laboratory studies, ordering and review of radiographic studies, pulse oximetry and re-evaluation of patient's condition.  Final Clinical Impression(s) / ED Diagnoses Final diagnoses:  History of anaphylaxis  Allergy to bee sting  Anaphylaxis, initial encounter    Rx / DC Orders ED Discharge Orders          Ordered    EPINEPHrine 0.3 mg/0.3 mL IJ SOAJ injection  As needed        05/19/21 1255    predniSONE (STERAPRED UNI-PAK 21 TAB) 10 MG (21) TBPK tablet  Daily        05/19/21 1255             Isla Pence, MD 05/19/21 1259

## 2021-05-19 NOTE — ED Triage Notes (Signed)
Pt to the ED with tongue swelling after haven been bitten by a bee directly in the tongue.  Pt states this happened at 0600 this morning and she has taken 25 mg of benadryl and used an Epi Pen at 0815.   Facial swelling is also noted. Airway patent.

## 2021-05-19 NOTE — ED Notes (Signed)
Pt reports her breathing feels "much better". Tongue is still swollen, but somewhat better.

## 2021-07-07 ENCOUNTER — Encounter: Payer: Self-pay | Admitting: Family Medicine

## 2021-07-07 ENCOUNTER — Ambulatory Visit (INDEPENDENT_AMBULATORY_CARE_PROVIDER_SITE_OTHER): Payer: Medicare HMO | Admitting: Family Medicine

## 2021-07-07 VITALS — BP 158/84 | HR 93 | Temp 97.8°F | Ht 63.0 in | Wt 147.0 lb

## 2021-07-07 DIAGNOSIS — H40033 Anatomical narrow angle, bilateral: Secondary | ICD-10-CM | POA: Diagnosis not present

## 2021-07-07 DIAGNOSIS — E1165 Type 2 diabetes mellitus with hyperglycemia: Secondary | ICD-10-CM

## 2021-07-07 DIAGNOSIS — I10 Essential (primary) hypertension: Secondary | ICD-10-CM

## 2021-07-07 DIAGNOSIS — E119 Type 2 diabetes mellitus without complications: Secondary | ICD-10-CM | POA: Diagnosis not present

## 2021-07-07 DIAGNOSIS — H3500 Unspecified background retinopathy: Secondary | ICD-10-CM

## 2021-07-07 LAB — HM DIABETES EYE EXAM

## 2021-07-07 MED ORDER — OZEMPIC (2 MG/DOSE) 8 MG/3ML ~~LOC~~ SOPN: 2.0000 mg | PEN_INJECTOR | SUBCUTANEOUS | 3 refills | Status: AC

## 2021-07-07 MED ORDER — DAPAGLIFLOZIN PROPANEDIOL 10 MG PO TABS: 10.0000 mg | ORAL_TABLET | Freq: Every day | ORAL | 3 refills | Status: DC

## 2021-07-07 NOTE — Progress Notes (Signed)
Established Patient Office Visit  Subjective:  Patient ID: Kelsey Porter, female    DOB: September 09, 1955  Age: 65 y.o. MRN: [id number]  CC:  Chief Complaint  Patient presents with   Hypertension    HPI Kelsey Porter presents for HTN. She had an eye exam toay and was told that she has bleeding behind her eyes and that she should see her PCP to have her BP checked. She had not had an eye exam in 2 years prior to today. This really worried and concerned her and she has been anxious about this today.   Her BP has 130s/80s when she checks it at home. She denies chest pain, shortness of breath, dizziness, vision changes, focal weakness, or edema.   She has been out of her ozempic for about 2 weeks due to a lapse in insurance. She now has medicare and would like a refill sent in on this. She also would like to see if farxiga will be covered.   Past Medical History:  Diagnosis Date   Allergy    Arthritis    Depression    Diabetes mellitus without complication (HCC)    History of Bell's palsy    Hyperlipidemia    Hypertension     Past Surgical History:  Procedure Laterality Date   BACK SURGERY  2002   COLON SURGERY     had part of her colon removed   COLONOSCOPY WITH PROPOFOL N/A 11/29/2020   Procedure: COLONOSCOPY WITH PROPOFOL;  Surgeon: Lanelle Bal, DO;  Location: [facility];  Service: Endoscopy;  Laterality: N/A;  AM-diabetic   POLYPECTOMY  11/29/2020   Procedure: POLYPECTOMY;  Surgeon: Lanelle Bal, DO;  Location: [facility];  Service: Endoscopy;;    Family History  Problem Relation Age of Onset   Alcohol abuse Mother    Arthritis Mother    COPD Mother    Diabetes Mother    Heart disease Mother    Hyperlipidemia Mother    Hypertension Mother    Kidney disease Mother    Cancer Father        lung   Diabetes Sister    Arthritis Maternal Grandmother    Diabetes Maternal Grandmother    Stroke Maternal Grandmother    Anxiety disorder Brother     Diabetes Brother    Colon cancer Neg Hx     Social History   Socioeconomic History   Marital status: Married    Spouse name: Casimiro Needle   Number of children: 2   Years of education: 14   Highest education level: Associate degree: occupational, Scientist, product/process development, or vocational program  Occupational History   Occupation: Retired  Tobacco Use   Smoking status: Every Day    Packs/day: 1.00    Years: 40.00    Pack years: 40.00    Types: Cigarettes   Smokeless tobacco: Never  Vaping Use   Vaping Use: Never used  Substance and Sexual Activity   Alcohol use: Never   Drug use: Never   Sexual activity: Yes    Birth control/protection: Post-menopausal  Other Topics Concern   Not on file  Social History Narrative   Not on file   Social Determinants of Health   Financial Resource Strain: Not on file  Food Insecurity: Not on file  Transportation Needs: Not on file  Physical Activity: Not on file  Stress: Not on file  Social Connections: Not on file  Intimate Partner Violence: Not on file    Outpatient Medications Prior  to Visit  Medication Sig Dispense Refill   amLODipine (NORVASC) 10 MG tablet Take 1 tablet (10 mg total) by mouth daily. 90 tablet 3   atorvastatin (LIPITOR) 20 MG tablet Take 1 tablet (20 mg total) by mouth daily. 90 tablet 3   EPINEPHrine 0.3 mg/0.3 mL IJ SOAJ injection Inject 0.3 mg into the muscle as needed for anaphylaxis. 1 each 2   FLUoxetine (PROZAC) 40 MG capsule Take 1 capsule (40 mg total) by mouth daily. 90 capsule 3   lisinopril (ZESTRIL) 10 MG tablet Take 1 tablet (10 mg total) by mouth daily. 90 tablet 3   Melatonin 10 MG TABS Take 10 mg by mouth at bedtime.     metFORMIN (GLUCOPHAGE XR) 500 MG 24 hr tablet Take 2 tablets (1,000 mg total) by mouth daily with breakfast. 60 tablet 2   Semaglutide, 2 MG/DOSE, (OZEMPIC, 2 MG/DOSE,) 8 MG/3ML SOPN Inject 2 mg into the skin once a week. 9 mL 3   Turmeric 500 MG TABS Take 500 mg by mouth daily.     dapagliflozin  propanediol (FARXIGA) 10 MG TABS tablet Take 1 tablet (10 mg total) by mouth daily. (Patient not taking: Reported on 05/19/2021) 90 tablet 3   Omega-3 Fatty Acids (OMEGA 3 PO) Take 1 capsule by mouth at bedtime.     predniSONE (STERAPRED UNI-PAK 21 TAB) 10 MG (21) TBPK tablet Take by mouth daily. Take 6 tabs by mouth daily  for 2 days, then 5 tabs for 2 days, then 4 tabs for 2 days, then 3 tabs for 2 days, 2 tabs for 2 days, then 1 tab by mouth daily for 2 days 42 tablet 0   No facility-administered medications prior to visit.    Allergies  Allergen Reactions   Bee Venom Anaphylaxis   Penicillin G Rash    Reaction: 25 years ago    ROS Review of Systems As per HPI.   Objective:    Physical Exam Vitals and nursing note reviewed.  Constitutional:      General: She is not in acute distress.    Appearance: She is not ill-appearing, toxic-appearing or diaphoretic.  Cardiovascular:     Rate and Rhythm: Normal rate and regular rhythm.     Heart sounds: Normal heart sounds. No murmur heard. Pulmonary:     Effort: Pulmonary effort is normal. No respiratory distress.     Breath sounds: Normal breath sounds.  Abdominal:     General: Bowel sounds are normal. There is no distension.     Palpations: Abdomen is soft.     Tenderness: There is no abdominal tenderness. There is no guarding or rebound.  Musculoskeletal:     Right lower leg: No edema.     Left lower leg: No edema.  Skin:    General: Skin is warm and dry.  Neurological:     General: No focal deficit present.     Mental Status: She is alert and oriented to person, place, and time.  Psychiatric:        Mood and Affect: Mood normal.        Behavior: Behavior normal.    BP (!) 158/84   Pulse 93   Temp 97.8 F (36.6 C) (Temporal)   Ht $R'5\' 3"'hw$  (1.6 m)   Wt 147 lb (66.7 kg)   BMI 26.04 kg/m  Wt Readings from Last 3 Encounters:  07/07/21 147 lb (66.7 kg)  05/19/21 154 lb 2 oz (69.9 kg)  05/05/21 154 lb 2 oz (  69.9 kg)      There are no preventive care reminders to display for this patient.  There are no preventive care reminders to display for this patient.  Lab Results  Component Value Date   TSH 2.250 08/04/2020   Lab Results  Component Value Date   WBC 9.4 05/19/2021   HGB 13.1 05/19/2021   HCT 40.4 05/19/2021   MCV 96.0 05/19/2021   PLT 375 05/19/2021   Lab Results  Component Value Date   NA 135 05/19/2021   K 3.9 05/19/2021   CO2 22 05/19/2021   GLUCOSE 156 (H) 05/19/2021   BUN 27 (H) 05/19/2021   CREATININE 1.10 (H) 05/19/2021   BILITOT 0.2 05/04/2021   ALKPHOS 82 05/04/2021   AST 17 05/04/2021   ALT 14 05/04/2021   PROT 7.0 05/04/2021   ALBUMIN 4.6 05/04/2021   CALCIUM 9.1 05/19/2021   ANIONGAP 9 05/19/2021   EGFR 59 (L) 05/04/2021   Lab Results  Component Value Date   CHOL 169 05/04/2021   Lab Results  Component Value Date   HDL 64 05/04/2021   Lab Results  Component Value Date   LDLCALC 86 05/04/2021   Lab Results  Component Value Date   TRIG 107 05/04/2021   Lab Results  Component Value Date   CHOLHDL 2.6 05/04/2021   Lab Results  Component Value Date   HGBA1C 6.0 (H) 05/04/2021      Assessment & Plan:   Kelvin was seen today for hypertension.  Diagnoses and all orders for this visit:  Primary hypertension Elevated today but well controlled at home. Is anxious today after eye exam this morning. She will continue to monitor BP at home and notify me if BP is > or = 140/90. If BP remains elevated, will increase lisinopril.   Type 2 diabetes mellitus with hyperglycemia, without long-term current use of insulin (HCC) A1c on 05/04/21 was 6.0. Refill provided on ozempic. Farxiga ordered. Discussed if unable to afford medication, will have her set up with [facility] for patient assistance if so. Once on ozepic and farxiga, can discontinue metformin.  -     Semaglutide, 2 MG/DOSE, (OZEMPIC, 2 MG/DOSE,) 8 MG/3ML SOPN; Inject 2 mg into the skin once a week. -      dapagliflozin propanediol (FARXIGA) 10 MG TABS tablet; Take 1 tablet (10 mg total) by mouth daily.  Retinopathy Awaiting records for eye exam today. BP and DM now well controlled. Discussed that it has been 2 years since her last eye exam and at the time her BP and DM were both uncontrolled and likely when the retinopathy originally occurred.    Follow-up:  if symptoms worsen or fail to improve.   The patient indicates understanding of these issues and agrees with the plan.    Gwenlyn Perking, FNP

## 2021-07-07 NOTE — Patient Instructions (Signed)

## 2021-07-07 NOTE — Addendum Note (Signed)
Addended by: Gwenlyn Perking on: 07/07/2021 03:27 PM   Modules accepted: Orders

## 2021-07-11 ENCOUNTER — Telehealth: Payer: Self-pay

## 2021-07-11 ENCOUNTER — Other Ambulatory Visit: Payer: Self-pay | Admitting: *Deleted

## 2021-07-11 DIAGNOSIS — E1165 Type 2 diabetes mellitus with hyperglycemia: Secondary | ICD-10-CM

## 2021-07-11 DIAGNOSIS — I152 Hypertension secondary to endocrine disorders: Secondary | ICD-10-CM

## 2021-07-11 DIAGNOSIS — E1169 Type 2 diabetes mellitus with other specified complication: Secondary | ICD-10-CM

## 2021-07-11 MED ORDER — LISINOPRIL 10 MG PO TABS
10.0000 mg | ORAL_TABLET | Freq: Every day | ORAL | 2 refills | Status: DC
Start: 2021-07-11 — End: 2022-03-10

## 2021-07-11 MED ORDER — AMLODIPINE BESYLATE 10 MG PO TABS: 10.0000 mg | ORAL_TABLET | Freq: Every day | ORAL | 2 refills | Status: DC

## 2021-07-11 MED ORDER — ATORVASTATIN CALCIUM 20 MG PO TABS: 20.0000 mg | ORAL_TABLET | Freq: Every day | ORAL | 2 refills | Status: DC

## 2021-07-11 NOTE — Chronic Care Management (AMB) (Signed)
  Chronic Care Management   Note  07/11/2021 Name: [name] MRN: [id number] DOB: 10/31/1955  [name] is a 65 y.o. year old female who is a primary care patient of Gwenlyn Perking, FNP. I reached out to [name] by phone today in response to a referral sent by Ms. Elissa Hefty PCP.  Ms. Nolton was given information about Chronic Care Management services today including:  CCM service includes personalized support from designated clinical staff supervised by her physician, including individualized plan of care and coordination with other care providers 24/7 contact phone numbers for assistance for urgent and routine care needs. Service will only be billed when office clinical staff spend 20 minutes or more in a month to coordinate care. Only one practitioner may furnish and bill the service in a calendar month. The patient may stop CCM services at any time (effective at the end of the month) by phone call to the office staff. The patient is responsible for co-pay (up to 20% after annual deductible is met) if co-pay is required by the individual health plan.   Patient agreed to services and verbal consent obtained.   Follow up plan: Telephone appointment with care management team member scheduled for:08/10/2021  Noreene Larsson, [address], [place], [place] [address] Direct Dial: [phone] [email or web address]

## 2021-07-12 DIAGNOSIS — Z0100 Encounter for examination of eyes and vision without abnormal findings: Secondary | ICD-10-CM | POA: Diagnosis not present

## 2021-08-03 ENCOUNTER — Telehealth: Payer: Self-pay | Admitting: Family Medicine

## 2021-08-03 ENCOUNTER — Ambulatory Visit (INDEPENDENT_AMBULATORY_CARE_PROVIDER_SITE_OTHER): Payer: Medicare HMO | Admitting: Nurse Practitioner

## 2021-08-03 ENCOUNTER — Encounter: Payer: Self-pay | Admitting: Nurse Practitioner

## 2021-08-03 DIAGNOSIS — U071 COVID-19: Secondary | ICD-10-CM

## 2021-08-03 MED ORDER — MOLNUPIRAVIR EUA 200MG CAPSULE: 4.0000 | ORAL_CAPSULE | Freq: Two times a day (BID) | ORAL | 0 refills | Status: AC

## 2021-08-03 MED ORDER — MOLNUPIRAVIR EUA 200MG CAPSULE: 4.0000 | ORAL_CAPSULE | Freq: Two times a day (BID) | ORAL | 0 refills | Status: DC

## 2021-08-03 NOTE — Progress Notes (Signed)
Virtual Visit  Note Due to COVID-19 pandemic this visit was conducted virtually. This visit type was conducted due to national recommendations for restrictions regarding the COVID-19 Pandemic (e.g. social distancing, sheltering in place) in an effort to limit this patient's exposure and mitigate transmission in our community. All issues noted in this document were discussed and addressed.  A physical exam was not performed with this format.  I connected with Kelsey Porter on 08/03/21 at 9:20 by telephone and verified that I am speaking with the correct person using two identifiers. Kelsey Porter is currently located at home and no one is currently with her during visit. The provider, Mary-Margaret Hassell Done, FNP is located in their office at time of visit.  I discussed the limitations, risks, security and privacy concerns of performing an evaluation and management service by telephone and the availability of in person appointments. I also discussed with the patient that there may be a patient responsible charge related to this service. The patient expressed understanding and agreed to proceed.   History and Present Illness:  Patient says on December 24 with nausea. It progressed to vomiting, achiness and fever. Cough and congestion. She tested positive for covid on July 30, 2021.      Review of Systems  Constitutional:  Positive for chills, fever and malaise/fatigue.  HENT:  Positive for congestion and sore throat.   Respiratory:  Positive for cough and sputum production. Negative for hemoptysis.   Musculoskeletal:  Positive for myalgias.  Neurological:  Positive for headaches.  All other systems reviewed and are negative.   Observations/Objective: Alert and oriented- answers all questions appropriately No distress Raspy voice Deep wet cough Sinus congestion noted in voice  Assessment and Plan: [name] in today with chief complaint of No chief complaint on  file.   1. Lab test positive for detection of COVID-19 virus 1. Take meds as prescribed 2. Use a cool mist humidifier especially during the winter months and when heat has been humid. 3. Use saline nose sprays frequently 4. Saline irrigations of the nose can be very helpful if done frequently.  * 4X daily for 1 week*  * Use of a nettie pot can be helpful with this. Follow directions with this* 5. Drink plenty of fluids 6. Keep thermostat turn down low 7.For any cough or congestion- mucinex 8. For fever or aces or pains- take tylenol or ibuprofen appropriate for age and weight.  * for fevers greater than 101 orally you may alternate ibuprofen and tylenol every  3 hours.    Meds ordered this encounter  Medications   molnupiravir EUA (LAGEVRIO) 200 mg CAPS capsule    Sig: Take 4 capsules (800 mg total) by mouth 2 (two) times daily for 5 days.    Dispense:  40 capsule    Refill:  0    Order Specific Question:   Supervising Provider    Answer:   Caryl Pina A [[id number]]      Follow Up Instructions: prn    I discussed the assessment and treatment plan with the patient. The patient was provided an opportunity to ask questions and all were answered. The patient agreed with the plan and demonstrated an understanding of the instructions.   The patient was advised to call back or seek an in-person evaluation if the symptoms worsen or if the condition fails to improve as anticipated.  The above assessment and management plan was discussed with the patient. The patient verbalized understanding of and has agreed  to the management plan. Patient is aware to call the clinic if symptoms persist or worsen. Patient is aware when to return to the clinic for a follow-up visit. Patient educated on when it is appropriate to go to the emergency department.   Time call ended:  9:33  I provided 12 minutes of  non face-to-face time during this encounter.    Mary-Margaret Hassell Done, FNP

## 2021-08-03 NOTE — Patient Instructions (Signed)

## 2021-08-03 NOTE — Telephone Encounter (Signed)
[facility]ent to [facility] patient aware

## 2021-08-10 ENCOUNTER — Ambulatory Visit (INDEPENDENT_AMBULATORY_CARE_PROVIDER_SITE_OTHER): Payer: Medicare HMO | Admitting: Pharmacist

## 2021-08-10 ENCOUNTER — Telehealth: Payer: Self-pay

## 2021-08-10 VITALS — BP 120/80

## 2021-08-10 DIAGNOSIS — N1831 Chronic kidney disease, stage 3a: Secondary | ICD-10-CM

## 2021-08-10 DIAGNOSIS — E1165 Type 2 diabetes mellitus with hyperglycemia: Secondary | ICD-10-CM

## 2021-08-10 NOTE — Progress Notes (Signed)
Chronic Care Management Pharmacy Note  08/10/2021 Name:  Kelsey Porter MRN:  [id number] DOB:  1956-06-27  Summary: T2DM, HTN, HLD  Recommendations/Changes made from today's visit:  Diabetes: New goal. Controlled-A1C 6.0%, GFR 59--patient has been without medication due to cost; needs assistance;  Continue metformin for now (1g with breakfast) Restart Ozempic will titrate back to 2mg  sq weekly (sample given) Denies personal and family history of Medullary thyroid cancer (MTC) Application submitted to Novo nordisk patient assistance program Medication to ship to PCP office per program requirements  Restart Farxiga 10mg  after UTI clears up/antibiotic finished Will apply to az&me pateint assistance program Medication to ship home Escribe to [facility] pharmacy per program rules Denies hypoglycemic/hyperglycemic symptoms Discussed meal planning options and Plate method for healthy eating Avoid sugary drinks and desserts Incorporate balanced protein, non starchy veggies, 1 serving of carbohydrate with each meal Increase water intake Increase physical activity as able Current exercise: n/a  Hypertension:  New goal. controlled; current treatment:norvasc 10mg , lisinopril 10mg ;  Current home readings: 120-130/65-80 Denies hypotensive/hypertensive symptoms Assessed true [employer]; continue current regimen  Hyperlipidemia:  New goal. Uncontrolled; current treatment: atorvastatin 20mg ;  Medications previously tried: n/a  Current dietary patterns: recommended heart healthy/healthy plate method/mediterranean  Counseled on statin, diet, lifestyle--may have to increase vs add medication at f/u LDL 86 goal <70 Patient Goals/Self-Care Activities patient will:  - take medications as prescribed as evidenced by patient report and record review check glucose daily, document, and provide at future appointments check blood pressure 3x weekly or when symptomatic, document, and provide at  future appointments collaborate with provider on medication access solutions target a minimum of 150 minutes of moderate intensity exercise weekly engage in dietary modifications by heart healthy, plate method, mediterranean  Plan: F/U IN 1 MONTH  Subjective: Kelsey Porter is an 66 y.o. year old female who is a primary patient of Gwenlyn Perking, [place].  The CCM team was consulted for assistance with disease management and care coordination needs.    Engaged with patient by telephone for initial visit in response to provider referral for pharmacy case management and/or care coordination services.   Consent to Services:  The patient was given the following information about Chronic Care Management services today, agreed to services, and gave verbal consent: 1. CCM service includes personalized support from designated clinical staff supervised by the primary care provider, including individualized plan of care and coordination with other care providers 2. 24/7 contact phone numbers for assistance for urgent and routine care needs. 3. Service will only be billed when office clinical staff spend 20 minutes or more in a month to coordinate care. 4. Only one practitioner may furnish and bill the service in a calendar month. 5.The patient may stop CCM services at any time (effective at the end of the month) by phone call to the office staff. 6. The patient will be responsible for cost sharing (co-pay) of up to 20% of the service fee (after annual deductible is met). Patient agreed to services and consent obtained.  Patient Care Team: Gwenlyn Perking, FNP as PCP - General (Family Medicine) Lavera Guise, [facility] as Pharmacist (Family Medicine) Eloise Harman, DO as Consulting Physician (Internal Medicine)  Objective:  Lab Results  Component Value Date   CREATININE 1.10 (H) 05/19/2021   CREATININE 1.05 (H) 05/04/2021   CREATININE 1.28 (H) 02/03/2021    Lab Results  Component Value Date    HGBA1C 6.0 (H) 05/04/2021   Last diabetic Eye  exam:  Lab Results  Component Value Date/Time   HMDIABEYEEXA Retinopathy (A) 07/07/2021 12:00 AM    Last diabetic Foot exam: No results found for: HMDIABFOOTEX      Component Value Date/Time   CHOL 169 05/04/2021 0822   TRIG 107 05/04/2021 0822   HDL 64 05/04/2021 0822   CHOLHDL 2.6 05/04/2021 0822   LDLCALC 86 05/04/2021 0822    Hepatic Function Latest Ref Rng & Units 05/04/2021 02/03/2021 11/01/2020  Total Protein 6.0 - 8.5 g/dL 7.0 7.0 7.1  Albumin 3.8 - 4.8 g/dL 4.6 4.6 4.5  AST 0 - 40 IU/L $Remov'17 15 16  'rFEhzM$ ALT 0 - 32 IU/L $Remov'14 12 17  'PsMsZT$ Alk Phosphatase 44 - 121 IU/L 82 75 92  Total Bilirubin 0.0 - 1.2 mg/dL 0.2 <0.2 <0.2    Lab Results  Component Value Date/Time   TSH 2.250 08/04/2020 03:48 PM    CBC Latest Ref Rng & Units 05/19/2021 05/04/2021 02/03/2021  WBC 4.0 - 10.5 K/uL 9.4 9.6 8.4  Hemoglobin 12.0 - 15.0 g/dL 13.1 13.1 13.1  Hematocrit 36.0 - 46.0 % 40.4 38.9 38.9  Platelets 150 - 400 K/uL 375 397 400    No results found for: VD25OH  Clinical ASCVD: No  The 10-year ASCVD risk score (Arnett DK, et al., 2019) is: 19%   Values used to calculate the score:     Age: 48 years     Sex: Female     Is Non-Hispanic African American: No     Diabetic: Yes     Tobacco smoker: Yes     Systolic Blood Pressure: 836 mmHg     Is BP treated: Yes     HDL Cholesterol: 64 mg/dL     Total Cholesterol: 169 mg/dL    Other: (CHADS2VASc if Afib, PHQ9 if depression, MMRC or CAT for COPD, ACT, DEXA)  Social History   Tobacco Use  Smoking Status Every Day   Packs/day: 1.00   Years: 40.00   Pack years: 40.00   Types: Cigarettes  Smokeless Tobacco Never   BP Readings from Last 3 Encounters:  08/11/21 134/70  08/14/21 120/80  07/07/21 (!) 158/84   Pulse Readings from Last 3 Encounters:  08/11/21 73  07/07/21 93  05/19/21 97   Wt Readings from Last 3 Encounters:  08/11/21 143 lb 12.8 oz (65.2 kg)  07/07/21 147 lb (66.7 kg)   05/19/21 154 lb 2 oz (69.9 kg)    Assessment: Review of patient past medical history, allergies, medications, health status, including review of consultants reports, laboratory and other test data, was performed as part of comprehensive evaluation and provision of chronic care management services.   SDOH:  (Social Determinants of Health) assessments and interventions performed:    CCM Care Plan  Allergies  Allergen Reactions   Bee Venom Anaphylaxis   Penicillin G Rash    Reaction: 25 years ago    Medications Reviewed Today     Reviewed by Lavera Guise, [facility] (Pharmacist) on 08/14/21 at [place] List Status: <None>   Medication Order Taking? Sig Documenting Provider Last Dose Status Informant  amLODipine (NORVASC) 10 MG tablet [id number] No Take 1 tablet (10 mg total) by mouth daily. Gwenlyn Perking, FNP Taking Active   atorvastatin (LIPITOR) 20 MG tablet [id number] No Take 1 tablet (20 mg total) by mouth daily. Gwenlyn Perking, FNP Taking Active   cephALEXin (KEFLEX) 500 MG capsule [id number]  Take 1 capsule (500 mg total) by mouth 2 (two) times  daily. Evelina Dun A, FNP  Active   dapagliflozin propanediol (FARXIGA) 10 MG TABS tablet [id number] No Take 1 tablet (10 mg total) by mouth daily. Gwenlyn Perking, FNP Taking Active   EPINEPHrine 0.3 mg/0.3 mL IJ SOAJ injection [id number] No Inject 0.3 mg into the muscle as needed for anaphylaxis. Isla Pence, MD Taking Active   FLUoxetine (PROZAC) 40 MG capsule [id number] No Take 1 capsule (40 mg total) by mouth daily. Gwenlyn Perking, FNP Taking Active Self           Med Note Clydell Hakim   Thu May 19, 2021 11:14 AM) Dispense date: 05/05/2021 Qty: 90.00 each  lisinopril (ZESTRIL) 10 MG tablet [id number] No Take 1 tablet (10 mg total) by mouth daily. Gwenlyn Perking, FNP Taking Active   Melatonin 10 MG TABS [id number] No Take 10 mg by mouth at bedtime. [provider] Taking Active Self  metFORMIN (GLUCOPHAGE XR)  500 MG 24 hr tablet [id number] No Take 2 tablets (1,000 mg total) by mouth daily with breakfast. Gwenlyn Perking, FNP Taking Active Self  Semaglutide, 2 MG/DOSE, (OZEMPIC, 2 MG/DOSE,) 8 MG/3ML SOPN [id number] No Inject 2 mg into the skin once a week. Gwenlyn Perking, FNP Taking Active            Med Note Blanca Friend, Omer Jack Aug 14, 2021 10:59 PM) VIA NOVO NORDISK PATIENT ASSISTANCE PROGRAM  Turmeric 500 MG TABS [id number] No Take 500 mg by mouth daily. [provider] Taking Active Self            Patient Active Problem List   Diagnosis Date Noted   Stage 3a chronic kidney disease ([place]) 05/05/2021   Orthostatic hypotension 12/22/2020   Encounter for screening colonoscopy 11/02/2020   Hyperlipidemia associated with type 2 diabetes mellitus ([place]) 05/17/2019   Bell's palsy 08/10/2017   Type 2 diabetes mellitus with hyperglycemia, without long-term current use of insulin ([place]) 04/06/2017   PTSD (post-traumatic stress disorder) 04/03/2017   Hypertension associated with diabetes ([place]) 04/03/2017   Major depressive disorder with current active episode 04/03/2017   Vasomotor rhinitis 03/26/2015   Allergy to bee sting 02/28/2015    Immunization History  Administered Date(s) Administered   Influenza Split 05/03/2015   Influenza,inj,Quad PF,6+ Mos 05/05/2021   Influenza-Unspecified 07/05/2020   Moderna Sars-Covid-2 Vaccination 11/30/2019, 12/27/2019, 07/19/2020   Pneumococcal Polysaccharide-23 02/28/2015   Tdap 06/19/2017    Conditions to be addressed/monitored: HTN, HLD, and DMII  Care Plan : PHARMD MEDICATION MANAGEMENT  Updates made by Lavera Guise, RPH since 08/14/2021 12:00 AM     Problem: DISEASE PROGRESSION PREVENTION      Long-Range Goal: T2DM, HTN, HLD PHARMD GOAL   This Visit's Progress: Not on track  Priority: High  Note:   Current Barriers:  Unable to independently afford treatment regimen Unable to maintain control of T2DM, HLD Suboptimal  therapeutic regimen for T2DM, HLD  Pharmacist Clinical Goal(s):  patient will verbalize ability to afford treatment regimen maintain control of T2DM, HLD, HTN as evidenced by GOAL A1C<7%, LDL<70, BP<130/80  adhere to plan to optimize therapeutic regimen for T2DM, HLD, HTN as evidenced by report of adherence to recommended medication management changes through collaboration with PharmD and provider.   Interventions: 1:1 collaboration with Gwenlyn Perking, FNP regarding development and update of comprehensive plan of care as evidenced by provider attestation and co-signature Inter-disciplinary care team collaboration (see longitudinal plan of care) Comprehensive medication review performed; medication list updated in  electronic medical record  Diabetes: New goal. Controlled-A1C 6.0%, GFR 59--patient has been without medication due to cost; needs assistance;  Continue metformin for now (1g with breakfast) Restart Ozempic will titrate back to 2mg  sq weekly (sample given) Denies personal and family history of Medullary thyroid cancer (MTC) Application submitted to Novo nordisk patient assistance program Medication to ship to PCP office per program requirements  Restart Farxiga 10mg  after UTI clears up/antibiotic finished Will apply to az&me pateint assistance program Medication to ship home Escribe to [facility] pharmacy per program rules Denies hypoglycemic/hyperglycemic symptoms Discussed meal planning options and Plate method for healthy eating Avoid sugary drinks and desserts Incorporate balanced protein, non starchy veggies, 1 serving of carbohydrate with each meal Increase water intake Increase physical activity as able Current exercise: n/a  Hypertension:  New goal. controlled; current treatment:norvasc 10mg , lisinopril 10mg ;  Current home readings: 120-130/65-80 Denies hypotensive/hypertensive symptoms Assessed true [employer]; continue current regimen  Hyperlipidemia:  New  goal. Uncontrolled; current treatment: atorvastatin 20mg ;  Medications previously tried: n/a  Current dietary patterns: recommended heart healthy/healthy plate method/mediterranean  Counseled on statin, diet, lifestyle--may have to increase vs add medication at f/u LDL 86 goal <70   Patient Goals/Self-Care Activities patient will:  - take medications as prescribed as evidenced by patient report and record review check glucose daily, document, and provide at future appointments check blood pressure 3x weekly or when symptomatic, document, and provide at future appointments collaborate with provider on medication access solutions target a minimum of 150 minutes of moderate intensity exercise weekly engage in dietary modifications by heart healthy, plate method, mediterranean      Medication Assistance: Application for AZ&ME (FARXIGA), NOVO [place] ([place]  medication assistance program. in process.  Anticipated assistance start date TBD FOR 2023.  See plan of care for additional detail.  Patient's preferred pharmacy is:  [facility] [address] [place] - [address] [place] [address] Phone: [phone] Fax: [phone]  [id number] [place], [place] [address] Phone: [phone] Fax: [phone]  Uses pill box? No - \N//A Pt endorses 100% compliance  Follow Up:  Patient agrees to Care Plan and Follow-up.  Plan: Telephone follow up appointment with care management team member scheduled for:  1 MONTH   Regina Eck, PharmD, BCPS Clinical Pharmacist, [place]  II Phone [phone]

## 2021-08-10 NOTE — Progress Notes (Signed)
Left detailed message to advise patient appt will change to phone call today per Pharm D   Noreene Larsson, [place], [place], [place] [address] Direct Dial: [phone] [email or web address] [email or web address] Website: [email or web address]

## 2021-08-11 ENCOUNTER — Encounter: Payer: Self-pay | Admitting: Family

## 2021-08-11 ENCOUNTER — Ambulatory Visit (INDEPENDENT_AMBULATORY_CARE_PROVIDER_SITE_OTHER): Payer: Medicare HMO | Admitting: Family

## 2021-08-11 VITALS — BP 134/70 | HR 73 | Temp 98.1°F | Ht 63.0 in | Wt 143.8 lb

## 2021-08-11 DIAGNOSIS — N3001 Acute cystitis with hematuria: Secondary | ICD-10-CM

## 2021-08-11 DIAGNOSIS — R319 Hematuria, unspecified: Secondary | ICD-10-CM | POA: Diagnosis not present

## 2021-08-11 LAB — MICROSCOPIC EXAMINATION
RBC, Urine: >30 /hpf — AB (ref 0–2)
Renal Epithel, UA: NONE SEEN /hpf

## 2021-08-11 LAB — URINALYSIS, COMPLETE
Bilirubin, UA: NEGATIVE
Glucose, UA: NEGATIVE
Leukocytes,UA: NEGATIVE
Nitrite, UA: NEGATIVE
Specific Gravity, UA: 1.025 (ref 1.005–1.030)
Urobilinogen, Ur: 0.2 mg/dL (ref 0.2–1.0)
pH, UA: 5.5 (ref 5.0–7.5)

## 2021-08-11 MED ORDER — CEPHALEXIN 500 MG PO CAPS: 500.0000 mg | ORAL_CAPSULE | Freq: Two times a day (BID) | ORAL | 0 refills | Status: DC

## 2021-08-11 NOTE — Patient Instructions (Signed)
Urinary Tract Infection, Adult A urinary tract infection (UTI) is an infection of any part of the urinary tract. The urinary tract includes the kidneys, ureters, bladder, and urethra. These organs make, store, and get rid of urine in the body. An upper UTI affects the ureters and kidneys. A lower UTI affects the bladder and urethra. What are the causes? Most urinary tract infections are caused by bacteria in your genital area around your urethra, where urine leaves your body. These bacteria grow and cause inflammation of your urinary tract. What increases the risk? You are more likely to develop this condition if: You have a urinary catheter that stays in place. You are not able to control when you urinate or have a bowel movement (incontinence). You are female and you: Use a spermicide or diaphragm for birth control. Have low estrogen levels. Are pregnant. You have certain genes that increase your risk. You are sexually active. You take antibiotic medicines. You have a condition that causes your flow of urine to slow down, such as: An enlarged prostate, if you are female. Blockage in your urethra. A kidney stone. A nerve condition that affects your bladder control (neurogenic bladder). Not getting enough to drink, or not urinating often. You have certain medical conditions, such as: Diabetes. A weak disease-fighting system (immunesystem). Sickle cell disease. Gout. Spinal cord injury. What are the signs or symptoms? Symptoms of this condition include: Needing to urinate right away (urgency). Frequent urination. This may include small amounts of urine each time you urinate. Pain or burning with urination. Blood in the urine. Urine that smells bad or unusual. Trouble urinating. Cloudy urine. Vaginal discharge, if you are female. Pain in the abdomen or the lower back. You may also have: Vomiting or a decreased appetite. Confusion. Irritability or tiredness. A fever or  chills. Diarrhea. The first symptom in older adults may be confusion. In some cases, they may not have any symptoms until the infection has worsened. How is this diagnosed? This condition is diagnosed based on your medical history and a physical exam. You may also have other tests, including: Urine tests. Blood tests. Tests for STIs (sexually transmitted infections). If you have had more than one UTI, a cystoscopy or imaging studies may be done to determine the cause of the infections. How is this treated? Treatment for this condition includes: Antibiotic medicine. Over-the-counter medicines to treat discomfort. Drinking enough water to stay hydrated. If you have frequent infections or have other conditions such as a kidney stone, you may need to see a health care provider who specializes in the urinary tract (urologist). In rare cases, urinary tract infections can cause sepsis. Sepsis is a life-threatening condition that occurs when the body responds to an infection. Sepsis is treated in the hospital with IV antibiotics, fluids, and other medicines. Follow these instructions at home: Medicines Take over-the-counter and prescription medicines only as told by your health care provider. If you were prescribed an antibiotic medicine, take it as told by your health care provider. Do not stop using the antibiotic even if you start to feel better. General instructions Make sure you: Empty your bladder often and completely. Do not hold urine for long periods of time. Empty your bladder after sex. Wipe from front to back after urinating or having a bowel movement if you are female. Use each tissue only one time when you wipe. Drink enough fluid to keep your urine pale yellow. Keep all follow-up visits. This is important. Contact a health care provider   if: Your symptoms do not get better after 1-2 days. Your symptoms go away and then return. Get help right away if: You have severe pain in your  back or your lower abdomen. You have a fever or chills. You have nausea or vomiting. Summary A urinary tract infection (UTI) is an infection of any part of the urinary tract, which includes the kidneys, ureters, bladder, and urethra. Most urinary tract infections are caused by bacteria in your genital area. Treatment for this condition often includes antibiotic medicines. If you were prescribed an antibiotic medicine, take it as told by your health care provider. Do not stop using the antibiotic even if you start to feel better. Keep all follow-up visits. This is important. This information is not intended to replace advice given to you by your health care provider. Make sure you discuss any questions you have with your health care provider. Document Revised: 03/05/2020 Document Reviewed: 03/05/2020 Elsevier Patient Education  2022 Elsevier Inc.  

## 2021-08-11 NOTE — Progress Notes (Signed)
Subjective:    Patient ID: Kelsey Porter, female    DOB: 05/31/1956, 66 y.o.   MRN: [id number]  Chief Complaint  Patient presents with   Hematuria   Dysuria   Pt presents to the office today with hematuria that started yesterday.  Hematuria This is a new problem. The current episode started yesterday. The problem has been gradually worsening since onset. Her pain is at a severity of 3/10. The pain is mild. She describes her urine color as light pink. Irritative symptoms include frequency, nocturia and urgency. Associated symptoms include dysuria and hesitancy. Pertinent negatives include no chills, fever, flank pain, nausea, urinary retention or vomiting.  Dysuria  Associated symptoms include frequency, hematuria, hesitancy and urgency. Pertinent negatives include no chills, flank pain, nausea or vomiting.     Review of Systems  Constitutional:  Negative for chills and fever.  Gastrointestinal:  Negative for nausea and vomiting.  Genitourinary:  Positive for dysuria, frequency, hematuria, hesitancy, nocturia and urgency. Negative for flank pain.  All other systems reviewed and are negative.     Objective:   Physical Exam Vitals reviewed.  Constitutional:      General: She is not in acute distress.    Appearance: She is well-developed.  HENT:     Head: Normocephalic and atraumatic.     Right Ear: Tympanic membrane normal.     Left Ear: Tympanic membrane normal.  Eyes:     Pupils: Pupils are equal, round, and reactive to light.  Neck:     Thyroid: No thyromegaly.  Cardiovascular:     Rate and Rhythm: Normal rate and regular rhythm.     Heart sounds: Normal heart sounds. No murmur heard. Pulmonary:     Effort: Pulmonary effort is normal. No respiratory distress.     Breath sounds: Normal breath sounds. No wheezing.  Abdominal:     General: Bowel sounds are normal. There is no distension.     Palpations: Abdomen is soft.     Tenderness: There is no abdominal  tenderness.  Musculoskeletal:        General: No tenderness. Normal range of motion.     Cervical back: Normal range of motion and neck supple.  Skin:    General: Skin is warm and dry.  Neurological:     Mental Status: She is alert and oriented to person, place, and time.     Cranial Nerves: No cranial nerve deficit.     Deep Tendon Reflexes: Reflexes are normal and symmetric.  Psychiatric:        Behavior: Behavior normal.        Thought Content: Thought content normal.        Judgment: Judgment normal.     BP 134/70    Pulse 73    Temp 98.1 F (36.7 C) (Temporal)    Ht 5\' 3"  (1.6 m)    Wt 143 lb 12.8 oz (65.2 kg)    BMI 25.47 kg/m       Assessment & Plan:  Kelsey Porter comes in today with chief complaint of Hematuria and Dysuria   Diagnosis and orders addressed:  1. Hematuria, unspecified type - Urinalysis, Complete - Urine Culture - cephALEXin (KEFLEX) 500 MG capsule; Take 1 capsule (500 mg total) by mouth 2 (two) times daily.  Dispense: 14 capsule; Refill: 0  2. Acute cystitis with hematuria Force fluids AZO over the counter X2 days RTO prn Culture pending - cephALEXin (KEFLEX) 500 MG capsule; Take 1 capsule (500 mg total)  by mouth 2 (two) times daily.  Dispense: 14 capsule; Refill: 0     Evelina Dun, FNP

## 2021-08-13 LAB — URINE CULTURE

## 2021-08-14 ENCOUNTER — Encounter: Payer: Self-pay | Admitting: Pharmacist

## 2021-08-14 NOTE — Patient Instructions (Addendum)
Visit Information  Thank you for taking time to visit with me today. Please don't hesitate to contact me if I can be of assistance to you before our next scheduled telephone appointment.  Following are the goals we discussed today:  Current Barriers:  Unable to independently afford treatment regimen Unable to maintain control of T2DM, HLD Suboptimal therapeutic regimen for T2DM, HLD  Pharmacist Clinical Goal(s):  patient will verbalize ability to afford treatment regimen maintain control of T2DM, HLD, HTN as evidenced by GOAL A1C<7%, LDL<70, BP<130/80  adhere to plan to optimize therapeutic regimen for T2DM, HLD, HTN as evidenced by report of adherence to recommended medication management changes through collaboration with PharmD and provider.   Interventions: 1:1 collaboration with Gwenlyn Perking, FNP regarding development and update of comprehensive plan of care as evidenced by provider attestation and co-signature Inter-disciplinary care team collaboration (see longitudinal plan of care) Comprehensive medication review performed; medication list updated in electronic medical record  Diabetes: New goal. Controlled-A1C 6.0%, GFR 59--patient has been without medication due to cost; needs assistance;  Continue metformin for now (1g with breakfast) Restart Ozempic will titrate back to 2mg  sq weekly (sample given) Denies personal and family history of Medullary thyroid cancer (MTC) Application submitted to [employer] n[employer] patient assistance program Medication to ship to PCP office per program requirements  Restart Farxiga 10mg  after UTI clears up/antibiotic finished Will apply to az&me pateint assistance program Medication to ship home Escribe to [facility] pharmacy per program rules Denies hypoglycemic/hyperglycemic symptoms Discussed meal planning options and Plate method for healthy eating Avoid sugary drinks and desserts Incorporate balanced protein, non starchy veggies, 1 serving of  carbohydrate with each meal Increase water intake Increase physical activity as able Current exercise: n/a  Hypertension:  New goal. controlled; current treatment:norvasc 10mg , lisinopril 10mg ;  Current home readings: 120-130/65-80 Denies hypotensive/hypertensive symptoms Assessed true [place] metric; continue current regimen  Hyperlipidemia:  New goal. Uncontrolled; current treatment: atorvastatin 20mg ;  Medications previously tried: n/a  Current dietary patterns: recommended heart healthy/healthy plate method/mediterranean  Counseled on statin, diet, lifestyle--may have to increase vs add medication at f/u LDL 86 goal <70   Patient Goals/Self-Care Activities patient will:  - take medications as prescribed as evidenced by patient report and record review check glucose daily, document, and provide at future appointments check blood pressure 3x weekly or when symptomatic, document, and provide at future appointments collaborate with provider on medication access solutions target a minimum of 150 minutes of moderate intensity exercise weekly engage in dietary modifications by heart healthy, plate method, mediterranean    Please call the care guide team at [phone] if you need to cancel or reschedule your appointment.   The patient verbalized understanding of instructions, educational materials, and care plan provided today and declined offer to receive copy of patient instructions, educational materials, and care plan.   Signature Regina Eck, PharmD, BCPS Clinical Pharmacist, [place]  II Phone [phone]

## 2021-08-23 ENCOUNTER — Ambulatory Visit: Payer: Medicare HMO | Admitting: Pharmacist

## 2021-08-23 DIAGNOSIS — E1169 Type 2 diabetes mellitus with other specified complication: Secondary | ICD-10-CM

## 2021-08-23 DIAGNOSIS — E1165 Type 2 diabetes mellitus with hyperglycemia: Secondary | ICD-10-CM

## 2021-08-23 DIAGNOSIS — E785 Hyperlipidemia, unspecified: Secondary | ICD-10-CM

## 2021-08-23 NOTE — Progress Notes (Signed)
Chronic Care Management Pharmacy Note  08/23/2021 Name:  Kelsey Porter MRN:  [id number] DOB:  04-17-1956  Summary: T2DM, HTN  Recommendations/Changes made from today's visit:  Diabetes: Goal on Track (progressing): YES. Controlled-A1C 6.0%, GFR 59--ozempic 46m, farxiga 111m metformin 1g  (patient was without meds due to cost; we have restarted her back on guideline directed therapy) Continue metformin for now (1g with breakfast)--plan to d/c CONTINUE Ozempic will titrate back to 34m23mq weekly (sample given) Denies personal and family history of Medullary thyroid cancer (MTC) Application submitted to Nov[employer] nordisk patient assistance program Medication to ship to PCP office per program requirements  CONTINUE Farxiga 73m50mpproved for 2023 az&me pateint assistance program Medication to ship home Escribe to [facility] pharmacy per program rules Additional samples provided today to bridge supply Denies hypoglycemic/hyperglycemic symptoms Discussed meal planning options and Plate method for healthy eating Avoid sugary drinks and desserts Incorporate balanced protein, non starchy veggies, 1 serving of carbohydrate with each meal Increase water intake Increase physical activity as able Current exercise: n/a  Hypertension:  Goal on Track (progressing): YES. controlled; current treatment:norvasc 73mg13msinopril 73mg;37mrrent home readings: 120-130/65-80 Denies hypotensive/hypertensive symptoms Assessed true [place] metric; continue current regimen  Hyperlipidemia:  New goal. Uncontrolled; current treatment: atorvastatin 20mg; 41mications previously tried: n/a  Current dietary patterns: recommended heart healthy/healthy plate method/mediterranean  Counseled on statin, diet, lifestyle--may have to increase vs add medication at f/u LDL 86 goal <70   Patient Goals/Self-Care Activities patient will:  - take medications as prescribed as evidenced by patient report and record  review check glucose daily, document, and provide at future appointments check blood pressure 3x weekly or when symptomatic, document, and provide at future appointments collaborate with provider on medication access solutions target a minimum of 150 minutes of moderate intensity exercise weekly engage in dietary modifications by heart healthy, plate method, mediterranean  Plan: F/U 3 MONTHS  Subjective: Del ULoyal Rudy66 y.o.56ear old female who is a primary patient of Morgan,Gwenlyn Perking The CCM team was consulted for assistance with disease management and care coordination needs.    Engaged with patient by telephone for follow up visit in response to provider referral for pharmacy case management and/or care coordination services.   Consent to Services:  The patient was given information about Chronic Care Management services, agreed to services, and gave verbal consent prior to initiation of services.  Please see initial visit note for detailed documentation.   Patient Care Team: Morgan,Gwenlyn Perkings PCP - General (Family Medicine) Starlett Pehrson,Lavera Guises [facility] (Family Medicine) Carver,Eloise Harman Consulting Physician (Internal Medicine)  Objective:  Lab Results  Component Value Date   CREATININE 1.10 (H) 05/19/2021   CREATININE 1.05 (H) 05/04/2021   CREATININE 1.28 (H) 02/03/2021    Lab Results  Component Value Date   HGBA1C 6.0 (H) 05/04/2021   Last diabetic Eye exam:  Lab Results  Component Value Date/Time   HMDIABEYEEXA Retinopathy (A) 07/07/2021 12:00 AM    Last diabetic Foot exam: No results found for: HMDIABFOOTEX      Component Value Date/Time   CHOL 169 05/04/2021 0822   TRIG 107 05/04/2021 0822   HDL 64 05/04/2021 0822   CHOLHDL 2.6 05/04/2021 0822   LDLCALC 86 05/04/2021 0822    Hepatic Function Latest Ref Rng & Units 05/04/2021 02/03/2021 11/01/2020  Total Protein 6.0 - 8.5 g/dL 7.0 7.0 7.1  Albumin 3.8 -  4.8 g/dL 4.6  4.6 4.5  AST 0 - 40 IU/L _0 ALT 0 - 32 IU/L _1 Alk Phosphatase 44 - 121 IU/L 82 75 92  Total Bilirubin 0.0 - 1.2 mg/dL 0.2 <0.2 <0.2    Lab Results  Component Value Date/Time   TSH 2.250 08/04/2020 03:48 PM    CBC Latest Ref Rng & Units 05/19/2021 05/04/2021 02/03/2021  WBC 4.0 - 10.5 K/uL 9.4 9.6 8.4  Hemoglobin 12.0 - 15.0 g/dL 13.1 13.1 13.1  Hematocrit 36.0 - 46.0 % 40.4 38.9 38.9  Platelets 150 - 400 K/uL 375 397 400    No results found for: VD25OH  Clinical ASCVD: No  The 10-year ASCVD risk score (Arnett DK, et al., 2019) is: 19%   Values used to calculate the score:     Age: 66 years     Sex: Female     Is Non-Hispanic African American: No     Diabetic: Yes     Tobacco smoker: Yes     Systolic Blood Pressure: 510 mmHg     Is BP treated: Yes     HDL Cholesterol: 64 mg/dL     Total Cholesterol: 169 mg/dL    Other: (CHADS2VASc if Afib, PHQ9 if depression, MMRC or CAT for COPD, ACT, DEXA)  Social History   Tobacco Use  Smoking Status Every Day   Packs/day: 1.00   Years: 40.00   Pack years: 40.00   Types: Cigarettes  Smokeless Tobacco Never   BP Readings from Last 3 Encounters:  08/11/21 134/70  08/14/21 120/80  07/07/21 (!) 158/84   Pulse Readings from Last 3 Encounters:  08/11/21 73  07/07/21 93  05/19/21 97   Wt Readings from Last 3 Encounters:  08/11/21 143 lb 12.8 oz (65.2 kg)  07/07/21 147 lb (66.7 kg)  05/19/21 154 lb 2 oz (69.9 kg)    Assessment: Review of patient past medical history, allergies, medications, health status, including review of consultants reports, laboratory and other test data, was performed as part of comprehensive evaluation and provision of chronic care management services.   SDOH:  (Social Determinants of Health) assessments and interventions performed:    CCM Care Plan  Allergies  Allergen Reactions   Bee Venom Anaphylaxis   Penicillin G Rash    Reaction: 25 years ago    Medications Reviewed Today      Reviewed by Lavera Guise, [facility] (Pharmacist) on 08/23/21 at 2144  Med List Status: <None>   Medication Order Taking? Sig Documenting Provider Last Dose Status Informant  amLODipine (NORVASC) 10 MG tablet [id number] No Take 1 tablet (10 mg total) by mouth daily. Gwenlyn Perking, FNP Taking Active   atorvastatin (LIPITOR) 20 MG tablet [id number] No Take 1 tablet (20 mg total) by mouth daily. Gwenlyn Perking, FNP Taking Active   cephALEXin (KEFLEX) 500 MG capsule [id number]  Take 1 capsule (500 mg total) by mouth 2 (two) times daily. Evelina Dun A, FNP  Active   dapagliflozin propanediol (FARXIGA) 10 MG TABS tablet [id number] No Take 1 tablet (10 mg total) by mouth daily. Gwenlyn Perking, FNP Taking Active            Med Note Blanca Friend, Omer Jack Aug 14, 2021 11:00 PM) VIA AZ&ME PATIENT ASSISTANCE PROGRAM  EPINEPHrine 0.3 mg/0.3 mL IJ SOAJ injection [id number] No Inject 0.3 mg into the muscle as needed for anaphylaxis. Isla Pence, MD Taking Active   FLUoxetine (PROZAC) 40  MG capsule [id number] No Take 1 capsule (40 mg total) by mouth daily. Gwenlyn Perking, FNP Taking Active Self           Med Note Clydell Hakim   Thu May 19, 2021 11:14 AM) Dispense date: 05/05/2021 Qty: 90.00 each  lisinopril (ZESTRIL) 10 MG tablet [id number] No Take 1 tablet (10 mg total) by mouth daily. Gwenlyn Perking, FNP Taking Active   Melatonin 10 MG TABS [id number] No Take 10 mg by mouth at bedtime. [provider] Taking Active Self  metFORMIN (GLUCOPHAGE XR) 500 MG 24 hr tablet [id number] No Take 2 tablets (1,000 mg total) by mouth daily with breakfast. Gwenlyn Perking, FNP Taking Active Self  Semaglutide, 2 MG/DOSE, (OZEMPIC, 2 MG/DOSE,) 8 MG/3ML SOPN [id number] No Inject 2 mg into the skin once a week. Gwenlyn Perking, FNP Taking Active            Med Note Blanca Friend, Omer Jack Aug 14, 2021 10:59 PM) VIA NOVO NORDISK PATIENT ASSISTANCE PROGRAM  Turmeric 500 MG TABS [id number] No Take 500  mg by mouth daily. [provider] Taking Active Self            Patient Active Problem List   Diagnosis Date Noted   Stage 3a chronic kidney disease ([place]) 05/05/2021   Orthostatic hypotension 12/22/2020   Encounter for screening colonoscopy 11/02/2020   Hyperlipidemia associated with type 2 diabetes mellitus ([place]) 05/17/2019   Bell's palsy 08/10/2017   Type 2 diabetes mellitus with hyperglycemia, without long-term current use of insulin ([place]) 04/06/2017   PTSD (post-traumatic stress disorder) 04/03/2017   Hypertension associated with diabetes ([place]) 04/03/2017   Major depressive disorder with current active episode 04/03/2017   Vasomotor rhinitis 03/26/2015   Allergy to bee sting 02/28/2015    Immunization History  Administered Date(s) Administered   Influenza Split 05/03/2015   Influenza,inj,Quad PF,6+ Mos 05/05/2021   Influenza-Unspecified 07/05/2020   Moderna Sars-Covid-2 Vaccination 11/30/2019, 12/27/2019, 07/19/2020   Pneumococcal Polysaccharide-23 02/28/2015   Tdap 06/19/2017    Conditions to be addressed/monitored: HTN and DMII  Care Plan : PHARMD MEDICATION MANAGEMENT  Updates made by Lavera Guise, RPH since 08/23/2021 12:00 AM     Problem: DISEASE PROGRESSION PREVENTION      Long-Range Goal: T2DM, HTN, HLD PHARMD GOAL   Recent Progress: Not on track  Priority: High  Note:   Current Barriers:  Unable to independently afford treatment regimen Unable to maintain control of T2DM, HLD Suboptimal therapeutic regimen for T2DM, HLD  Pharmacist Clinical Goal(s):  patient will verbalize ability to afford treatment regimen maintain control of T2DM, HLD, HTN as evidenced by GOAL A1C<7%, LDL<70, BP<130/80  adhere to plan to optimize therapeutic regimen for T2DM, HLD, HTN as evidenced by report of adherence to recommended medication management changes through collaboration with PharmD and provider.   Interventions: 1:1 collaboration with Gwenlyn Perking, FNP regarding development and update of comprehensive plan of care as evidenced by provider attestation and co-signature Inter-disciplinary care team collaboration (see longitudinal plan of care) Comprehensive medication review performed; medication list updated in electronic medical record  Diabetes: Goal on Track (progressing): YES. Controlled-A1C 6.0%, GFR 59--ozempic 54m, farxiga 159m metformin 1g  (patient was without meds due to cost; we have restarted her back on guideline directed therapy) Continue metformin for now (1g with breakfast)--plan to d/c CONTINUE Ozempic will titrate back to 1m23mq weekly (sample given) Denies personal and family history of Medullary thyroid cancer (MTC)  Application submitted to Novo nordisk patient assistance program Medication to ship to PCP office per program requirements  CONTINUE Farxiga 39m  Approved for 2023 az&me pateint assistance program Medication to ship home Escribe to [facility] pharmacy per program rules Additional samples provided today to bridge supply Denies hypoglycemic/hyperglycemic symptoms Discussed meal planning options and Plate method for healthy eating Avoid sugary drinks and desserts Incorporate balanced protein, non starchy veggies, 1 serving of carbohydrate with each meal Increase water intake Increase physical activity as able Current exercise: n/a  Hypertension:  Goal on Track (progressing): YES. controlled; current treatment:norvasc 129m lisinopril 1080m Current home readings: 120-130/65-80 Denies hypotensive/hypertensive symptoms Assessed true nor[place]; continue current regimen  Hyperlipidemia:  New goal. Uncontrolled; current treatment: atorvastatin 9m89mMedications previously tried: n/a  Current dietary patterns: recommended heart healthy/healthy plate method/mediterranean  Counseled on statin, diet, lifestyle--may have to increase vs add medication at f/u LDL 86 goal <70   Patient Goals/Self-Care  Activities patient will:  - take medications as prescribed as evidenced by patient report and record review check glucose daily, document, and provide at future appointments check blood pressure 3x weekly or when symptomatic, document, and provide at future appointments collaborate with provider on medication access solutions target a minimum of 150 minutes of moderate intensity exercise weekly engage in dietary modifications by heart healthy, plate method, mediterranean      Medication Assistance:  FARXIGA obtained through AZ&ME  medication assistance program.  Enrollment ends 08/06/22 [place] STILL PENDING  Patient's preferred pharmacy is:  [facility] [id number]: [phone]: [phone]/pharmacy #[id number]- [place]: [phone] Up:  Patient agrees to Care Plan and Follow-up.  Plan: Telephone follow up appointment with care management team member scheduled for:  3 MONTHS   JulieRegina EckrmD, BCPS Clinical Pharmacist, [place] [phone]

## 2021-08-23 NOTE — Patient Instructions (Signed)
Visit Information  Following are the goals we discussed today:  Current Barriers:  Unable to independently afford treatment regimen Unable to maintain control of T2DM, HLD Suboptimal therapeutic regimen for T2DM, HLD  Pharmacist Clinical Goal(s):  patient will verbalize ability to afford treatment regimen maintain control of T2DM, HLD, HTN as evidenced by GOAL A1C<7%, LDL<70, BP<130/80  adhere to plan to optimize therapeutic regimen for T2DM, HLD, HTN as evidenced by report of adherence to recommended medication management changes through collaboration with PharmD and provider.   Interventions: 1:1 collaboration with Kelsey Perking, FNP regarding development and update of comprehensive plan of care as evidenced by provider attestation and co-signature Inter-disciplinary care team collaboration (see longitudinal plan of care) Comprehensive medication review performed; medication list updated in electronic medical record  Diabetes: Goal on Track (progressing): YES. Controlled-A1C 6.0%, GFR 59--ozempic 2mg , farxiga 10mg , metformin 1g  (patient was without meds due to cost; we have restarted her back on guideline directed therapy) Continue metformin for now (1g with breakfast)--plan to d/c CONTINUE Ozempic will titrate back to 2mg  sq weekly (sample given) Denies personal and family history of Medullary thyroid cancer (MTC) Application submitted to Novo nordisk patient assistance program Medication to ship to PCP office per program requirements  CONTINUE Farxiga 10mg   Approved for 2023 az&me pateint assistance program Medication to ship home Escribe to [facility] pharmacy per program rules Additional samples provided today to bridge supply Denies hypoglycemic/hyperglycemic symptoms Discussed meal planning options and Plate method for healthy eating Avoid sugary drinks and desserts Incorporate balanced protein, non starchy veggies, 1 serving of carbohydrate with each meal Increase water  intake Increase physical activity as able Current exercise: n/a  Hypertension:  Goal on Track (progressing): YES. controlled; current treatment:norvasc 10mg , lisinopril 10mg ;  Current home readings: 120-130/65-80 Denies hypotensive/hypertensive symptoms Assessed true Kelsey Porter; continue current regimen  Hyperlipidemia:  New goal. Uncontrolled; current treatment: atorvastatin 20mg ;  Medications previously tried: n/a  Current dietary patterns: recommended heart healthy/healthy plate method/mediterranean  Counseled on statin, diet, lifestyle--may have to increase vs add medication at f/u LDL 86 goal <70   Patient Goals/Self-Care Activities patient will:  - take medications as prescribed as evidenced by patient report and record review check glucose daily, document, and provide at future appointments check blood pressure 3x weekly or when symptomatic, document, and provide at future appointments collaborate with provider on medication access solutions target a minimum of 150 minutes of moderate intensity exercise weekly engage in dietary modifications by heart healthy, plate method, mediterranean   Plan: Telephone follow up appointment with care management team member scheduled for:  3 months  Signature Kelsey Porter, PharmD, BCPS Clinical Pharmacist, [place]  II Phone [phone]   Please call the care guide team at [phone] if you need to cancel or reschedule your appointment.   The patient verbalized understanding of instructions, educational materials, and care plan provided today and agreed to receive a mailed copy of patient instructions, educational materials, and care plan.

## 2021-09-06 DIAGNOSIS — E1165 Type 2 diabetes mellitus with hyperglycemia: Secondary | ICD-10-CM

## 2021-09-06 DIAGNOSIS — E1169 Type 2 diabetes mellitus with other specified complication: Secondary | ICD-10-CM | POA: Diagnosis not present

## 2021-09-06 DIAGNOSIS — E785 Hyperlipidemia, unspecified: Secondary | ICD-10-CM | POA: Diagnosis not present

## 2021-09-06 DIAGNOSIS — N1831 Chronic kidney disease, stage 3a: Secondary | ICD-10-CM

## 2021-09-07 ENCOUNTER — Telehealth: Payer: Self-pay | Admitting: Family Medicine

## 2021-09-07 NOTE — Telephone Encounter (Signed)
Pt asked to talk to [place]. She wants to know if ozempic has been approved. She says that is not a PA but it is medication assistance. Pt is out on Friday. Please call back

## 2021-09-07 NOTE — Telephone Encounter (Signed)
PLEASE LET PATIENT KNOW: -There is a Psychologist, prison and probation services on ozempic (including patient assistance  -We do not have samples at this time of ozempic to give -She may have to pick up at local drug store for one month while awaiting shipment.  CALLED INTO [facility] IT SHOULD BE $47 FOR 1 MONTH BUT HAVE PATIENT CHECK -Patient assistance program delays are expected to continue throughout the year.   -We will call her when product arrives  Will route to [place], CPhT due to potential missing info on application

## 2021-09-08 ENCOUNTER — Telehealth: Payer: Self-pay

## 2021-09-08 NOTE — Telephone Encounter (Signed)
Received notification from [name] regarding approval for [name]. Patient assistance approved from 09/08/21 to 08/06/22.  Medication will take about 30+ days to deliver to office  Phone: [phone]   LEFT VOICEMAIL INFORMING PT OF APPROVAL FROM [employer] [place]. ALSO INFORMED THAT OFFICE DOES NOT HAVE SAMPLES OF MEDICATION REQUESTED AT THE MOMENT HOWEVER IT CAN BE CALLED INTO HER PHARMACY IF NEEDED & SHOULD BE ABOUT $47. GAVE PT MY DIRECT LINE TO CALL BACK WITH QUESTIONS [phone]

## 2021-09-12 ENCOUNTER — Ambulatory Visit (INDEPENDENT_AMBULATORY_CARE_PROVIDER_SITE_OTHER): Payer: Medicare HMO | Admitting: Family

## 2021-09-12 ENCOUNTER — Encounter: Payer: Self-pay | Admitting: Family

## 2021-09-12 VITALS — Temp 97.8°F | Ht 63.0 in | Wt 143.8 lb

## 2021-09-12 DIAGNOSIS — E1159 Type 2 diabetes mellitus with other circulatory complications: Secondary | ICD-10-CM

## 2021-09-12 DIAGNOSIS — I951 Orthostatic hypotension: Secondary | ICD-10-CM

## 2021-09-12 DIAGNOSIS — I152 Hypertension secondary to endocrine disorders: Secondary | ICD-10-CM

## 2021-09-12 DIAGNOSIS — E1165 Type 2 diabetes mellitus with hyperglycemia: Secondary | ICD-10-CM | POA: Diagnosis not present

## 2021-09-12 DIAGNOSIS — F331 Major depressive disorder, recurrent, moderate: Secondary | ICD-10-CM | POA: Diagnosis not present

## 2021-09-12 MED ORDER — AMLODIPINE BESYLATE 5 MG PO TABS: 5.0000 mg | ORAL_TABLET | Freq: Every day | ORAL | 1 refills | Status: DC

## 2021-09-12 MED ORDER — FLUOXETINE HCL 40 MG PO CAPS: 40.0000 mg | ORAL_CAPSULE | Freq: Every day | ORAL | 3 refills | Status: DC

## 2021-09-12 NOTE — Progress Notes (Signed)
Subjective:    Patient ID: Kelsey Porter, female    DOB: 1955-09-09, 66 y.o.   MRN: [id number]  Chief Complaint  Patient presents with   Hypotension   Pt presents to the office today to discuss ortho hypotension. States since having COVID on 07/30/21 she has decreased her smoking from a pack a day and now she is only smoking 3 cigarettes. She states since decreasing her BP has decreased. The last last week, she has noticed dizziness with standing.    She has also lost 56 lbs over the last year.  Hypertension This is a chronic problem. The current episode started more than 1 year ago. The problem has been resolved since onset. The problem is controlled. Pertinent negatives include no blurred vision, headaches, malaise/fatigue, peripheral edema or shortness of breath. Risk factors for coronary artery disease include diabetes mellitus and dyslipidemia. The current treatment provides moderate improvement. There is no history of CVA or heart failure.  Diabetes She presents for her follow-up diabetic visit. She has type 2 diabetes mellitus. Pertinent negatives for hypoglycemia include no headaches. Pertinent negatives for diabetes include no blurred vision and no foot paresthesias. Symptoms are stable. Pertinent negatives for diabetic complications include no CVA. Risk factors for coronary artery disease include dyslipidemia, diabetes mellitus, sedentary lifestyle and hypertension. She is following a generally healthy diet. Her overall blood glucose range is 110-130 mg/dl.  Depression        This is a chronic problem.  The current episode started more than 1 year ago.   Associated symptoms include no helplessness, no hopelessness, not irritable, no restlessness, no headaches and not sad.  Past treatments include SSRIs - Selective serotonin reuptake inhibitors.    Review of Systems  Constitutional:  Negative for malaise/fatigue.  Eyes:  Negative for blurred vision.  Respiratory:  Negative for  shortness of breath.   Neurological:  Negative for headaches.  Psychiatric/Behavioral:  Positive for depression.   All other systems reviewed and are negative.     Objective:   Physical Exam Vitals reviewed.  Constitutional:      General: She is not irritable.She is not in acute distress.    Appearance: She is well-developed.  HENT:     Head: Normocephalic and atraumatic.     Right Ear: Tympanic membrane normal.     Left Ear: Tympanic membrane normal.  Eyes:     Pupils: Pupils are equal, round, and reactive to light.  Neck:     Thyroid: No thyromegaly.  Cardiovascular:     Rate and Rhythm: Normal rate and regular rhythm.     Heart sounds: Normal heart sounds. No murmur heard. Pulmonary:     Effort: Pulmonary effort is normal. No respiratory distress.     Breath sounds: Normal breath sounds. No wheezing.  Abdominal:     General: Bowel sounds are normal. There is no distension.     Palpations: Abdomen is soft.     Tenderness: There is no abdominal tenderness.  Musculoskeletal:        General: No tenderness. Normal range of motion.     Cervical back: Normal range of motion and neck supple.  Skin:    General: Skin is warm and dry.  Neurological:     Mental Status: She is alert and oriented to person, place, and time.     Cranial Nerves: No cranial nerve deficit.     Deep Tendon Reflexes: Reflexes are normal and symmetric.  Psychiatric:  Behavior: Behavior normal.        Thought Content: Thought content normal.        Judgment: Judgment normal.      Temp 97.8 F (36.6 C) (Oral)    Ht 5\' 3"  (1.6 m)    Wt 143 lb 12.8 oz (65.2 kg)    SpO2 98%    BMI 25.47 kg/m   Assessment & Plan:  Aneesa Romey comes in today with chief complaint of Hypotension   Diagnosis and orders addressed:  1. Hypertension associated with diabetes ([place]) Will decrease Norvasc to 5 mg from 10 mg  Continue to lose weight and healthy diet - amLODipine (NORVASC) 5 MG tablet; Take 1 tablet  (5 mg total) by mouth daily.  Dispense: 90 tablet; Refill: 1  2. Orthostatic hypotension  3. Type 2 diabetes mellitus with hyperglycemia, without long-term current use of insulin (HCC) Continue medications   4. Moderate episode of recurrent major depressive disorder (HCC) - FLUoxetine (PROZAC) 40 MG capsule; Take 1 capsule (40 mg total) by mouth daily.  Dispense: 90 capsule; Refill: 3   Health Maintenance reviewed Diet and exercise encouraged  Follow up plan: 2-4 weeks to recheck HTN   Evelina Dun, FNP

## 2021-09-12 NOTE — Patient Instructions (Signed)
Orthostatic Hypotension Blood pressure is a measurement of how strongly, or weakly, your circulating blood is pressing against the walls of your arteries. Orthostatic hypotension is a drop in blood pressure that can happen when you change positions, such as when you go from lying down to standing. Arteries are blood vessels that carry blood from your heart throughout your body. When blood pressure is too low, you may not get enough blood to your brain or to the rest of your organs. Orthostatic hypotension can cause light-headedness, sweating, rapid heartbeat, blurred vision, and fainting. These symptoms require further investigation into the cause. What are the causes? Orthostatic hypotension can be caused by many things, including: Sudden changes in posture, such as standing up quickly after you have been sitting or lying down. Loss of blood (anemia) or loss of body fluids (dehydration). Heart problems, neurologic problems, or hormone problems. Pregnancy. Aging. The risk for this condition increases as you get older. Severe infection (sepsis). Certain medicines, such as medicines for high blood pressure or medicines that make the body lose excess fluids (diuretics). What are the signs or symptoms? Symptoms of this condition may include: Weakness, light-headedness, or dizziness. Sweating. Blurred vision. Tiredness (fatigue). Rapid heartbeat. Fainting, in severe cases. How is this diagnosed? This condition is diagnosed based on: Your symptoms and medical history. Your blood pressure measurements. Your health care provider will check your blood pressure when you are: Lying down. Sitting. Standing. A blood pressure reading is recorded as two numbers, such as "120 over 80" (or 120/80). The first ("top") number is called the systolic pressure. It is a measure of the pressure in your arteries as your heart beats. The second ("bottom") number is called the diastolic pressure. It is a measure of  the pressure in your arteries when your heart relaxes between beats. Blood pressure is measured in a unit called mmHg. Healthy blood pressure for most adults is 120/80 mmHg. Orthostatic hypotension is defined as a 20 mmHg drop in systolic pressure or a 10 mmHg drop in diastolic pressure within 3 minutes of standing. Other information or tests that may be used to diagnose orthostatic hypotension include: Your other vital signs, such as your heart rate and temperature. Blood tests. An electrocardiogram (ECG) or echocardiogram. A Holter monitor. This is a device you wear that records your heart rhythm continuously, usually for 24-48 hours. Tilt table test. For this test, you will be safely secured to a table that moves you from a lying position to an upright position. Your heart rhythm and blood pressure will be monitored during the test. How is this treated? This condition may be treated by: Changing your diet. This may involve eating more salt (sodium) or drinking more water. Changing the dosage of certain medicines you are taking that might be lowering your blood pressure. Correcting the underlying reason for the orthostatic hypotension. Wearing compression stockings. Taking medicines to raise your blood pressure. Avoiding actions that trigger symptoms. Follow these instructions at home: Medicines Take over-the-counter and prescription medicines only as told by your health care provider. Follow instructions from your health care provider about changing the dosage of your current medicines, if this applies. Do not stop or adjust any of your medicines on your own. Eating and drinking  Drink enough fluid to keep your urine pale yellow. Eat extra salt only as directed. Do not add extra salt to your diet unless advised by your health care provider. Eat frequent, small meals. Avoid standing up suddenly after eating. General instructions    Get up slowly from lying down or sitting positions. This  gives your blood pressure a chance to adjust. Avoid hot showers and excessive heat as directed by your health care provider. Engage in regular physical activity as directed by your health care provider. If you have compression stockings, wear them as told. Keep all follow-up visits. This is important. Contact a health care provider if: You have a fever for more than 2-3 days. You feel more thirsty than usual. You feel dizzy or weak. Get help right away if: You have chest pain. You have a fast or irregular heartbeat. You become sweaty or feel light-headed. You feel short of breath. You faint. You have any symptoms of a stroke. "BE FAST" is an easy way to remember the main warning signs of a stroke: B - Balance. Signs are dizziness, sudden trouble walking, or loss of balance. E - Eyes. Signs are trouble seeing or a sudden change in vision. F - Face. Signs are sudden weakness or numbness of the face, or the face or eyelid drooping on one side. A - Arms. Signs are weakness or numbness in an arm. This happens suddenly and usually on one side of the body. S - Speech. Signs are sudden trouble speaking, slurred speech, or trouble understanding what people say. T - Time. Time to call emergency services. Write down what time symptoms started. You have other signs of a stroke, such as: A sudden, severe headache with no known cause. Nausea or vomiting. Seizure. These symptoms may represent a serious problem that is an emergency. Do not wait to see if the symptoms will go away. Get medical help right away. Call your local emergency services (911 in the [place] Do not drive yourself to the hospital. Summary Orthostatic hypotension is a sudden drop in blood pressure. It can cause light-headedness, sweating, rapid heartbeat, blurred vision, and fainting. Orthostatic hypotension can be diagnosed by having your blood pressure taken while lying down, sitting, and then standing. Treatment may involve  changing your diet, wearing compression stockings, sitting up slowly, adjusting your medicines, or correcting the underlying reason for the orthostatic hypotension. Get help right away if you have chest pain, a fast or irregular heartbeat, or symptoms of a stroke. This information is not intended to replace advice given to you by your health care provider. Make sure you discuss any questions you have with your health care provider. Document Revised: 10/07/2020 Document Reviewed: 10/07/2020 Elsevier Patient Education  2022 Elsevier Inc.  

## 2021-09-19 ENCOUNTER — Other Ambulatory Visit: Payer: Self-pay | Admitting: Internal Medicine

## 2021-09-29 ENCOUNTER — Ambulatory Visit (INDEPENDENT_AMBULATORY_CARE_PROVIDER_SITE_OTHER): Payer: Medicare HMO | Admitting: Family

## 2021-09-29 ENCOUNTER — Encounter: Payer: Self-pay | Admitting: Family

## 2021-09-29 VITALS — BP 125/66 | HR 85 | Temp 98.0°F | Ht 63.0 in | Wt 142.4 lb

## 2021-09-29 DIAGNOSIS — E1159 Type 2 diabetes mellitus with other circulatory complications: Secondary | ICD-10-CM | POA: Diagnosis not present

## 2021-09-29 DIAGNOSIS — R42 Dizziness and giddiness: Secondary | ICD-10-CM

## 2021-09-29 DIAGNOSIS — I951 Orthostatic hypotension: Secondary | ICD-10-CM

## 2021-09-29 DIAGNOSIS — I152 Hypertension secondary to endocrine disorders: Secondary | ICD-10-CM | POA: Diagnosis not present

## 2021-09-29 DIAGNOSIS — Z8616 Personal history of COVID-19: Secondary | ICD-10-CM | POA: Diagnosis not present

## 2021-09-29 NOTE — Telephone Encounter (Signed)
Patient is in office check on this again today. Please advise . She is aware of delays have you heard anything  ?

## 2021-09-29 NOTE — Progress Notes (Signed)
Subjective:    Patient ID: Kelsey Porter, female    DOB: 1955-12-26, 66 y.o.   MRN: [id number]  Chief Complaint  Patient presents with   Hypertension   Pt presents to the office today to recheck BP. She was seen on 09/12/21 with complaints of ortho static hypotension since having COVID 07/30/21. We decreased her Norvasc to 5 mg from 10 mg. She reports her dizziness has improved.   Her BP is at goal today.   She reports she has decreased her salt, fat, and smoking.  Hypertension This is a chronic problem. The current episode started more than 1 year ago. The problem has been resolved since onset. The problem is controlled. Associated symptoms include malaise/fatigue. Pertinent negatives include no headaches, peripheral edema or shortness of breath. Risk factors for coronary artery disease include dyslipidemia, smoking/tobacco exposure and sedentary lifestyle. The current treatment provides moderate improvement.     Review of Systems  Constitutional:  Positive for malaise/fatigue.  Respiratory:  Negative for shortness of breath.   Neurological:  Negative for headaches.  All other systems reviewed and are negative.     Objective:   Physical Exam Vitals reviewed.  Constitutional:      General: She is not in acute distress.    Appearance: She is well-developed.  HENT:     Head: Normocephalic and atraumatic.     Right Ear: Tympanic membrane normal.     Left Ear: Tympanic membrane normal.  Eyes:     Pupils: Pupils are equal, round, and reactive to light.  Neck:     Thyroid: No thyromegaly.  Cardiovascular:     Rate and Rhythm: Normal rate and regular rhythm.     Heart sounds: Normal heart sounds. No murmur heard. Pulmonary:     Effort: Pulmonary effort is normal. No respiratory distress.     Breath sounds: Rhonchi present. No wheezing.  Abdominal:     General: Bowel sounds are normal. There is no distension.     Palpations: Abdomen is soft.     Tenderness: There is no  abdominal tenderness.  Musculoskeletal:        General: No tenderness. Normal range of motion.     Cervical back: Normal range of motion and neck supple.  Skin:    General: Skin is warm and dry.  Neurological:     Mental Status: She is alert and oriented to person, place, and time.     Cranial Nerves: No cranial nerve deficit.     Deep Tendon Reflexes: Reflexes are normal and symmetric.  Psychiatric:        Behavior: Behavior normal.        Thought Content: Thought content normal.        Judgment: Judgment normal.      BP 125/66    Pulse 85    Temp 98 F (36.7 C) (Temporal)    Ht 5\' 3"  (1.6 m)    Wt 142 lb 6.4 oz (64.6 kg)    BMI 25.23 kg/m      Assessment & Plan:  [name] comes in today with chief complaint of Hypertension   Diagnosis and orders addressed:  1. Hypertension associated with diabetes (HCC) At goal  -Dash diet information given -Exercise encouraged - Stress Management  -Continue current meds - CMP14+EGFR  2. Orthostatic hypotension Improving  - CMP14+EGFR  3. Dizziness Improving  - CMP14+EGFR  4. History of COVID-19 Continue to decrease smoking    Labs pending Health Maintenance reviewed Diet and  exercise encouraged  Follow up plan: Keep chronic follow up with PCP   Evelina Dun, FNP

## 2021-09-29 NOTE — Patient Instructions (Signed)

## 2021-09-30 LAB — CMP14+EGFR
ALT: 20 IU/L (ref 0–32)
AST: 19 IU/L (ref 0–40)
Albumin/Globulin Ratio: 2 (ref 1.2–2.2)
Albumin: 4.5 g/dL (ref 3.8–4.8)
Alkaline Phosphatase: 89 IU/L (ref 44–121)
BUN/Creatinine Ratio: 17 (ref 12–28)
BUN: 20 mg/dL (ref 8–27)
Bilirubin Total: 0.2 mg/dL (ref 0.0–1.2)
CO2: 24 mmol/L (ref 20–29)
Calcium: 9.8 mg/dL (ref 8.7–10.3)
Chloride: 102 mmol/L (ref 96–106)
Creatinine, Ser: 1.16 mg/dL — ABNORMAL HIGH (ref 0.57–1.00)
Globulin, Total: 2.3 g/dL (ref 1.5–4.5)
Glucose: 83 mg/dL (ref 70–99)
Potassium: 4.7 mmol/L (ref 3.5–5.2)
Sodium: 138 mmol/L (ref 134–144)
Total Protein: 6.8 g/dL (ref 6.0–8.5)
eGFR: 52 mL/min/{1.73_m2} — ABNORMAL LOW (ref >59)

## 2021-10-20 ENCOUNTER — Telehealth: Payer: Self-pay

## 2021-10-20 NOTE — Telephone Encounter (Signed)
Contacted patient to inform that we have received four boxes of Ozempic and these will be placed in the refrigerator for her to pick up. ?

## 2021-10-21 ENCOUNTER — Encounter: Payer: Self-pay | Admitting: *Deleted

## 2021-11-15 ENCOUNTER — Ambulatory Visit (INDEPENDENT_AMBULATORY_CARE_PROVIDER_SITE_OTHER): Payer: Medicare HMO | Admitting: Pharmacist

## 2021-11-15 DIAGNOSIS — E1169 Type 2 diabetes mellitus with other specified complication: Secondary | ICD-10-CM

## 2021-11-15 DIAGNOSIS — E1165 Type 2 diabetes mellitus with hyperglycemia: Secondary | ICD-10-CM

## 2021-11-15 MED ORDER — DAPAGLIFLOZIN PROPANEDIOL 10 MG PO TABS: 10.0000 mg | ORAL_TABLET | Freq: Every day | ORAL | 3 refills | Status: DC

## 2021-11-15 NOTE — Patient Instructions (Signed)
Visit Information ? ?Following are the goals we discussed today:  ?Summary: ?Diabetes: Goal on Track (progressing): YES. ?Controlled-A1C 6.0%, GFR 59--Ozempic '2mg'$ , Farxiga '10mg'$ , metformin 1g  ?Past meds:metformin ?CONTINUE Ozempic '2mg'$  sq weekly (sample given) ?Denies personal and family history of Medullary thyroid cancer (MTC) ?Shipment arrived on 10/20/21 to PCP office ?CONTINUE Farxiga '10mg'$  daily ?Approved for 2023 az&me pateint assistance program ?Medication to ship home ?[facility] to [facility] per program rules ?Denies hypoglycemic/hyperglycemic symptoms ?Discussed meal planning options and Plate method for healthy eating ?Avoid sugary drinks and desserts ?Incorporate balanced protein, non starchy veggies, 1 serving of carbohydrate with each meal ?Increase water intake ?Increase physical activity as able ?Current exercise: n/a ? ?Plan: Telephone follow up appointment with care management team member scheduled for:  3 months ? ?Signature ?Regina Eck, PharmD, BCPS ?Clinical Pharmacist, [facility] ?[place]  [facility] Phone [phone] ? ?Please call the care guide team at [phone] if you need to cancel or reschedule your appointment.  ? ?Patient verbalizes understanding of instructions and care plan provided today and agrees to view in [facility]. Active MyChart status confirmed with patient.   ? ?

## 2021-11-15 NOTE — Progress Notes (Signed)
? ?Chronic Care Management ?Pharmacy Note ? ?11/15/2021 ?Name:  Kelsey Scholten MRN:  [id number] DOB:  1955/08/13 ? ?Summary: ?Diabetes: Goal on Track (progressing): YES. ?Controlled-A1C 6.0%, GFR 59--Ozempic 59m, Farxiga 1425m metformin 1g  ?Past meds:metformin ?CONTINUE Ozempic 25m21mq weekly (sample given) ?Denies personal and family history of Medullary thyroid cancer (MTC) ?Shipment arrived on 10/20/21 to PCP office ?CONTINUE Farxiga 50m75mily ?Approved for 2023 az&me pateint assistance program ?Medication to ship home ?[facility] to [facility] pharmacy per program rules ?Denies hypoglycemic/hyperglycemic symptoms ?Discussed meal planning options and Plate method for healthy eating ?Avoid sugary drinks and desserts ?Incorporate balanced protein, non starchy veggies, 1 serving of carbohydrate with each meal ?Increase water intake ?Increase physical activity as able ?Current exercise: n/a ? ?Hypertension:  Goal on Track (progressing): YES. ?controlled; current treatment: norvasc 50mg70mmg, lisinopril 50mg;9murrent home readings: 120-130/65-80 ?Denies hypotensive/hypertensive symptoms ?Assessed [employer] current regimen ? ?Hyperlipidemia:  Goal on track: NO. ?Uncontrolled; current treatment: atorvastatin 20mg; 97mdications previously tried: n/a  ?Current dietary patterns: recommended heart healthy/healthy plate method/mediterranean  ?Counseled on statin, diet, lifestyle--may have to increase vs add medication at f/u LDL 86 goal <70 ? ? ?Patient Goals/Self-Care Activities ?patient will:  ?- take medications as prescribed as evidenced by patient report and record review ?check glucose daily, document, and provide at future appointments ?check blood pressure 3x weekly or when symptomatic, document, and provide at future appointments ?collaborate with provider on medication access solutions ?target a minimum of 150 minutes of moderate intensity exercise weekly ?engage in dietary modifications by  heart healthy, plate method, mediterranean ? ?Subjective: ?Kelsey UNyanna Heideman65 y.o.57ear old female who is a primary patient of Morgan,Gwenlyn Perking The CCM team was consulted for assistance with disease management and care coordination needs.   ? ?Engaged with patient by telephone for follow up visit in response to provider referral for pharmacy case management and/or care coordination services.  ? ?Consent to Services:  ?The patient was given information about Chronic Care Management services, agreed to services, and gave verbal consent prior to initiation of services.  Please see initial visit note for detailed documentation.  ? ?Patient Care Team: ?Morgan,Gwenlyn Perkings PCP - General (Family Medicine) ?Devany Aja,Lavera Guises [facility] ([facility]) ?Carver,Eloise Harman Consulting Physician (Internal Medicine) ? ?Objective: ? ?Lab Results  ?Component Value Date  ? CREATININE 1.16 (H) 09/29/2021  ? CREATININE 1.10 (H) 05/19/2021  ? CREATININE 1.05 (H) 05/04/2021  ? ? ?Lab Results  ?Component Value Date  ? HGBA1C 6.0 (H) 05/04/2021  ? ?Last diabetic Eye exam:  ?Lab Results  ?Component Value Date/Time  ? HMDIABEYEEXA Retinopathy (A) 07/07/2021 12:00 AM  ?  ?Last diabetic Foot exam: No results found for: HMDIABFOOTEX  ? ?   ?Component Value Date/Time  ? CHOL 169 05/04/2021 0822  ? TRIG 107 05/04/2021 0822  ? HDL 64 05/04/2021 0822  ? CHOLHDL 2.6 05/04/2021 0822  ?9518[place]28/2022 0822  ? ? ? ?  Latest Ref Rng & Units 09/29/2021  ? 12:23 PM 05/04/2021  ?  8:22 AM 02/03/2021  ?  9:13 AM  ?Hepatic Function  ?Total Protein 6.0 - 8.5 g/dL 6.8   7.0   7.0    ?Albumin 3.8 - 4.8 g/dL 4.5   4.6   4.6    ?AST 0 - 40 IU/L _0 ?ALT 0 - 32 IU/L 20  14   12    ?Alk Phosphatase 44 - 121 IU/L 89   82   75    ?Total Bilirubin 0.0 - 1.2 mg/dL 0.2   0.2   <0.2    ? ? ?Lab Results  ?Component Value Date/Time  ? TSH 2.250 08/04/2020 03:48 PM  ? ? ? ?  Latest Ref Rng & Units 05/19/2021  ?  9:59  AM 05/04/2021  ?  8:22 AM 02/03/2021  ?  9:13 AM  ?CBC  ?WBC 4.0 - 10.5 K/uL 9.4   9.6   8.4    ?Hemoglobin 12.0 - 15.0 g/dL 13.1   13.1   13.1    ?Hematocrit 36.0 - 46.0 % 40.4   38.9   38.9    ?Platelets 150 - 400 K/uL 375   397   400    ? ? ?No results found for: VD25OH ? ?Clinical ASCVD: No  ?The 10-year ASCVD risk score (Arnett DK, et al., 2019) is: 20.4% ?  Values used to calculate the score: ?    Age: 21 years ?    Sex: Female ?    Is Non-Hispanic African American: No ?    Diabetic: Yes ?    Tobacco smoker: Yes ?    Systolic Blood Pressure: 962 mmHg ?    Is BP treated: Yes ?    HDL Cholesterol: 64 mg/dL ?    Total Cholesterol: 169 mg/dL   ? ?Other: (CHADS2VASc if Afib, PHQ9 if depression, MMRC or CAT for COPD, ACT, DEXA) ? ?Social History  ? ?Tobacco Use  ?Smoking Status Every Day  ? Packs/day: 1.00  ? Years: 40.00  ? Pack years: 40.00  ? Types: Cigarettes  ?Smokeless Tobacco Never  ? ?BP Readings from Last 3 Encounters:  ?09/29/21 125/66  ?08/11/21 134/70  ?08/14/21 120/80  ? ?Pulse Readings from Last 3 Encounters:  ?09/29/21 85  ?08/11/21 73  ?07/07/21 93  ? ?Wt Readings from Last 3 Encounters:  ?09/29/21 142 lb 6.4 oz (64.6 kg)  ?09/12/21 143 lb 12.8 oz (65.2 kg)  ?08/11/21 143 lb 12.8 oz (65.2 kg)  ? ? ?Assessment: Review of patient past medical history, allergies, medications, health status, including review of consultants reports, laboratory and other test data, was performed as part of comprehensive evaluation and provision of chronic care management services.  ? ?SDOH:  (Social Determinants of Health) assessments and interventions performed:  ? ? ?CCM Care Plan ? ?Allergies  ?Allergen Reactions  ? Bee Venom Anaphylaxis  ? Penicillin G Rash  ?  Reaction: 25 years ago  ? ? ?Medications Reviewed Today   ? ? Reviewed by Lavera Guise, [facility] (Pharmacist) on 11/15/21 at [phone]  Med List Status: <None>  ? ?Medication Order Taking? Sig Documenting Provider Last Dose Status Informant  ?amLODipine (NORVASC) 5 MG  tablet [id number]  Take 1 tablet (5 mg total) by mouth daily. Sharion Balloon, FNP  Active   ?atorvastatin (LIPITOR) 20 MG tablet [id number] No Take 1 tablet (20 mg total) by mouth daily. Gwenlyn Perking, FNP Taking Active   ?dapagliflozin propanediol (FARXIGA) 10 MG TABS tablet [id number] No Take 1 tablet (10 mg total) by mouth daily. Gwenlyn Perking, FNP Taking Active   ?         ?Med Note (Lucill Mauck D   Sun Aug 14, 2021 11:00 PM) VIA AZ&ME PATIENT ASSISTANCE PROGRAM  ?EPINEPHrine 0.3 mg/0.3 mL IJ SOAJ injection [id number] No Inject 0.3 mg into the muscle as needed  for anaphylaxis. Isla Pence, MD Taking Active   ?FLUoxetine (PROZAC) 40 MG capsule [id number]  Take 1 capsule (40 mg total) by mouth daily. Evelina Dun A, FNP  Active   ?lisinopril (ZESTRIL) 10 MG tablet [id number] No Take 1 tablet (10 mg total) by mouth daily. Gwenlyn Perking, FNP Taking Active   ?Melatonin 10 MG TABS [id number] No Take 10 mg by mouth at bedtime. [provider] Taking Active Self  ?Semaglutide, 2 MG/DOSE, (OZEMPIC, 2 MG/DOSE,) 8 MG/3ML SOPN [id number] No Inject 2 mg into the skin once a week. Gwenlyn Perking, FNP Taking Active   ?         ?Med Note Blanca Friend, Royce Macadamia   Sun Aug 14, 2021 10:59 PM) VIA [facility] PATIENT ASSISTANCE PROGRAM  ?Turmeric 500 MG TABS [id number] No Take 500 mg by mouth daily. [provider] Taking Active Self  ? ?  ?  ? ?  ? ? ?Patient Active Problem List  ? Diagnosis Date Noted  ? Stage 3a chronic kidney disease ([place]) 05/05/2021  ? Orthostatic hypotension 12/22/2020  ? Encounter for screening colonoscopy 11/02/2020  ? Hyperlipidemia associated with type 2 diabetes mellitus ([place]) 05/17/2019  ? Bell's palsy 08/10/2017  ? Type 2 diabetes mellitus with hyperglycemia, without long-term current use of insulin ([place]) 04/06/2017  ? PTSD (post-traumatic stress disorder) 04/03/2017  ? Hypertension associated with diabetes ([place]) 04/03/2017  ? Major depressive disorder with current active  episode 04/03/2017  ? Vasomotor rhinitis 03/26/2015  ? Allergy to bee sting 02/28/2015  ? ? ?Immunization History  ?Administered Date(s) Administered  ? Influenza Split 05/03/2015  ? Influenza,inj,Quad PF,6+ Mos

## 2021-12-04 DIAGNOSIS — E785 Hyperlipidemia, unspecified: Secondary | ICD-10-CM

## 2021-12-04 DIAGNOSIS — F17210 Nicotine dependence, cigarettes, uncomplicated: Secondary | ICD-10-CM

## 2021-12-04 DIAGNOSIS — I10 Essential (primary) hypertension: Secondary | ICD-10-CM

## 2021-12-04 DIAGNOSIS — E1159 Type 2 diabetes mellitus with other circulatory complications: Secondary | ICD-10-CM

## 2021-12-04 DIAGNOSIS — Z7984 Long term (current) use of oral hypoglycemic drugs: Secondary | ICD-10-CM

## 2021-12-21 ENCOUNTER — Ambulatory Visit (INDEPENDENT_AMBULATORY_CARE_PROVIDER_SITE_OTHER): Payer: Medicare HMO | Admitting: Pharmacist

## 2021-12-21 DIAGNOSIS — E1169 Type 2 diabetes mellitus with other specified complication: Secondary | ICD-10-CM

## 2021-12-21 DIAGNOSIS — E1165 Type 2 diabetes mellitus with hyperglycemia: Secondary | ICD-10-CM

## 2021-12-21 NOTE — Progress Notes (Signed)
Chronic Care Management Pharmacy Note  12/21/2021 Name:  Kelsey Porter MRN:  [id number] DOB:  1956-06-03  Summary: Diabetes: Goal on Track (progressing): YES. Controlled-A1C 6.0%, GFR 59--Ozempic 2mg , Farxiga 10mg  CONTINUE Ozempic 2mg  sq weekly Denies personal and family history of Medullary thyroid cancer (MTC) Shipment arrived on 10/20/21 to PCP office REFILLS [place] 10mg  daily Approved for 2023 az&me pateint assistance program Medication to ship home Escribe to [facility] pharmacy per program rules Denies hypoglycemic/hyperglycemic symptoms Discussed meal planning options and Plate method for healthy eating Avoid sugary drinks and desserts Incorporate balanced protein, non starchy veggies, 1 serving of carbohydrate with each meal Increase water intake Increase physical activity as able Current exercise: n/a  Hypertension:  Goal on Track (progressing): YES. Controlled; current treatment:norvasc 10mg -->5mg , lisinopril 10mg ;  Current home readings: 120-130/65-80 Denies hypotensive/hypertensive symptoms Assessed true [place] metric; continue current regimen  Hyperlipidemia:  Goal on track: NO. Uncontrolled; current treatment: atorvastatin 20mg ;  Medications previously tried: n/a  Current dietary patterns: recommended heart healthy/healthy plate method/mediterranean  Counseled on statin, diet, lifestyle--may have to increase vs add medication at f/u LDL 86 goal <70  Subjective: Kelsey Porter is an 66 y.o. year old female who is a primary patient of Kelsey Perking, FNP.  The CCM team was consulted for assistance with disease management and care coordination needs.    Engaged with patient by telephone for follow up visit in response to provider referral for pharmacy case management and/or care coordination services.   Consent to Services:  The patient was given information about Chronic Care Management services, agreed to services, and  gave verbal consent prior to initiation of services.  Please see initial visit note for detailed documentation.   Patient Care Team: Kelsey Perking, FNP as PCP - General (Family Medicine) Lavera Guise, [facility] as Pharmacist (Family Medicine) Eloise Harman, DO as Consulting Physician (Internal Medicine)  Objective:  Lab Results  Component Value Date   CREATININE 1.16 (H) 09/29/2021   CREATININE 1.10 (H) 05/19/2021   CREATININE 1.05 (H) 05/04/2021    Lab Results  Component Value Date   HGBA1C 6.0 (H) 05/04/2021   Last diabetic Eye exam:  Lab Results  Component Value Date/Time   HMDIABEYEEXA Retinopathy (A) 07/07/2021 12:00 AM    Last diabetic Foot exam: No results found for: HMDIABFOOTEX      Component Value Date/Time   CHOL 169 05/04/2021 0822   TRIG 107 05/04/2021 0822   HDL 64 05/04/2021 0822   CHOLHDL 2.6 05/04/2021 0822   LDLCALC 86 05/04/2021 0822       Latest Ref Rng & Units 09/29/2021   12:23 PM 05/04/2021    8:22 AM 02/03/2021    9:13 AM  Hepatic Function  Total Protein 6.0 - 8.5 g/dL 6.8   7.0   7.0    Albumin 3.8 - 4.8 g/dL 4.5   4.6   4.6    AST 0 - 40 IU/L 19   17   15     ALT 0 - 32 IU/L 20   14   12     Alk Phosphatase 44 - 121 IU/L 89   82   75    Total Bilirubin 0.0 - 1.2 mg/dL 0.2   0.2   <0.2      Lab Results  Component Value Date/Time   TSH 2.250 08/04/2020 03:48 PM       Latest Ref Rng & Units 05/19/2021    9:59 AM 05/04/2021  8:22 AM 02/03/2021    9:13 AM  CBC  WBC 4.0 - 10.5 K/uL 9.4   9.6   8.4    Hemoglobin 12.0 - 15.0 g/dL 13.1   13.1   13.1    Hematocrit 36.0 - 46.0 % 40.4   38.9   38.9    Platelets 150 - 400 K/uL 375   397   400      No results found for: VD25OH  Clinical ASCVD: No  The 10-year ASCVD risk score (Arnett DK, et al., 2019) is: 20.4%   Values used to calculate the score:     Age: 66 years     Sex: Female     Is Non-Hispanic African American: No     Diabetic: Yes     Tobacco smoker: Yes     Systolic  Blood Pressure: 125 mmHg     Is BP treated: Yes     HDL Cholesterol: 64 mg/dL     Total Cholesterol: 169 mg/dL    Other: (CHADS2VASc if Afib, PHQ9 if depression, MMRC or CAT for COPD, ACT, DEXA)  Social History   Tobacco Use  Smoking Status Every Day   Packs/day: 1.00   Years: 40.00   Pack years: 40.00   Types: Cigarettes  Smokeless Tobacco Never   BP Readings from Last 3 Encounters:  09/29/21 125/66  08/11/21 134/70  08/14/21 120/80   Pulse Readings from Last 3 Encounters:  09/29/21 85  08/11/21 73  07/07/21 93   Wt Readings from Last 3 Encounters:  09/29/21 142 lb 6.4 oz (64.6 kg)  09/12/21 143 lb 12.8 oz (65.2 kg)  08/11/21 143 lb 12.8 oz (65.2 kg)    Assessment: Review of patient past medical history, allergies, medications, health status, including review of consultants reports, laboratory and other test data, was performed as part of comprehensive evaluation and provision of chronic care management services.   SDOH:  (Social Determinants of Health) assessments and interventions performed:    CCM Care Plan  Allergies  Allergen Reactions   Bee Venom Anaphylaxis   Penicillin G Rash    Reaction: 25 years ago    Medications Reviewed Today     Reviewed by Lavera Guise, [facility] (Pharmacist) on 11/15/21 at 64  Med List Status: <None>   Medication Order Taking? Sig Documenting Provider Last Dose Status Informant  amLODipine (NORVASC) 5 MG tablet [id number]  Take 1 tablet (5 mg total) by mouth daily. Evelina Dun A, FNP  Active   atorvastatin (LIPITOR) 20 MG tablet [id number] No Take 1 tablet (20 mg total) by mouth daily. Kelsey Perking, FNP Taking Active   dapagliflozin propanediol (FARXIGA) 10 MG TABS tablet [id number]  Take 1 tablet (10 mg total) by mouth daily. Kelsey Perking, FNP  Active   EPINEPHrine 0.3 mg/0.3 mL IJ SOAJ injection [id number] No Inject 0.3 mg into the muscle as needed for anaphylaxis. Isla Pence, MD Taking Active   FLUoxetine  (PROZAC) 40 MG capsule [id number]  Take 1 capsule (40 mg total) by mouth daily. Evelina Dun A, FNP  Active   lisinopril (ZESTRIL) 10 MG tablet [id number] No Take 1 tablet (10 mg total) by mouth daily. Kelsey Perking, FNP Taking Active   Melatonin 10 MG TABS [id number] No Take 10 mg by mouth at bedtime. [provider] Taking Active Self  Semaglutide, 2 MG/DOSE, (OZEMPIC, 2 MG/DOSE,) 8 MG/3ML SOPN [id number] No Inject 2 mg into the skin once a week. Kelsey Perking, FNP Taking Active  Med Note Blanca Friend, Royce Macadamia   Sun Aug 14, 2021 10:59 PM) VIA [facility] PATIENT ASSISTANCE PROGRAM  Turmeric 500 MG TABS [id number] No Take 500 mg by mouth daily. [provider] Taking Active Self            Patient Active Problem List   Diagnosis Date Noted   Stage 3a chronic kidney disease ([place]) 05/05/2021   Orthostatic hypotension 12/22/2020   Encounter for screening colonoscopy 11/02/2020   Hyperlipidemia associated with type 2 diabetes mellitus ([place]) 05/17/2019   Bell's palsy 08/10/2017   Type 2 diabetes mellitus with hyperglycemia, without long-term current use of insulin ([place]) 04/06/2017   PTSD (post-traumatic stress disorder) 04/03/2017   Hypertension associated with diabetes ([place]) 04/03/2017   Major depressive disorder with current active episode 04/03/2017   Vasomotor rhinitis 03/26/2015   Allergy to bee sting 02/28/2015    Immunization History  Administered Date(s) Administered   Influenza Split 05/03/2015   Influenza,inj,Quad PF,6+ Mos 05/05/2021   Influenza-Unspecified 07/05/2020   Moderna Sars-Covid-2 Vaccination 11/30/2019, 12/27/2019, 07/19/2020   Pneumococcal Polysaccharide-23 02/28/2015   Tdap 06/19/2017    Conditions to be addressed/monitored: HTN, HLD, and DMII  Care Plan : PHARMD MEDICATION MANAGEMENT  Updates made by Lavera Guise, RPH since 01/04/2022 12:00 AM     Problem: DISEASE PROGRESSION PREVENTION      Long-Range Goal: T2DM,  HTN, HLD PHARMD GOAL   Recent Progress: Not on track  Priority: High  Note:   Current Barriers:  Unable to independently afford treatment regimen Unable to maintain control of T2DM, HLD Suboptimal therapeutic regimen for T2DM, HLD  Pharmacist Clinical Goal(s):  patient will verbalize ability to afford treatment regimen maintain control of T2DM, HLD, HTN as evidenced by GOAL A1C<7%, LDL<70, BP<130/80  adhere to plan to optimize therapeutic regimen for T2DM, HLD, HTN as evidenced by report of adherence to recommended medication management changes through collaboration with PharmD and provider.   Interventions: 1:1 collaboration with Kelsey Perking, FNP regarding development and update of comprehensive plan of care as evidenced by provider attestation and co-signature Inter-disciplinary care team collaboration (see longitudinal plan of care) Comprehensive medication review performed; medication list updated in electronic medical record  Diabetes: Goal on Track (progressing): YES. Controlled-A1C 6.0%, GFR 59--Ozempic 2mg , Farxiga 10mg  CONTINUE Ozempic 2mg  sq weekly Denies personal and family history of Medullary thyroid cancer (MTC) Shipment arrived on 10/20/21 to PCP office REFILLS [place] 10mg  daily Approved for 2023 az&me pateint assistance program Medication to ship home Escribe to [facility] pharmacy per program rules Denies hypoglycemic/hyperglycemic symptoms Discussed meal planning options and Plate method for healthy eating Avoid sugary drinks and desserts Incorporate balanced protein, non starchy veggies, 1 serving of carbohydrate with each meal Increase water intake Increase physical activity as able Current exercise: n/a  Hypertension:  Goal on Track (progressing): YES. Controlled; current treatment:norvasc 10mg -->5mg , lisinopril 10mg ;  Current home readings: 120-130/65-80 Denies hypotensive/hypertensive symptoms Assessed true [place] metric;  continue current regimen  Hyperlipidemia:  Goal on track: NO. Uncontrolled; current treatment: atorvastatin 20mg ;  Medications previously tried: n/a  Current dietary patterns: recommended heart healthy/healthy plate method/mediterranean  Counseled on statin, diet, lifestyle--may have to increase vs add medication at f/u LDL 86 goal <70   Patient Goals/Self-Care Activities patient will:  - take medications as prescribed as evidenced by patient report and record review check glucose daily, document, and provide at future appointments check blood pressure 3x weekly or when symptomatic, document, and provide at future appointments  collaborate with provider on medication access solutions target a minimum of 150 minutes of moderate intensity exercise weekly engage in dietary modifications by heart healthy, plate method, mediterranean     Follow Up:  Patient agrees to Care Plan and Follow-up.  Plan: Telephone follow up appointment with care management team member scheduled for:  3 MONTHS    Regina Eck, PharmD, BCPS Clinical Pharmacist, [place]  II Phone [phone]

## 2022-01-04 DIAGNOSIS — E1159 Type 2 diabetes mellitus with other circulatory complications: Secondary | ICD-10-CM

## 2022-01-04 DIAGNOSIS — I10 Essential (primary) hypertension: Secondary | ICD-10-CM | POA: Diagnosis not present

## 2022-01-04 DIAGNOSIS — E785 Hyperlipidemia, unspecified: Secondary | ICD-10-CM | POA: Diagnosis not present

## 2022-01-04 DIAGNOSIS — F17210 Nicotine dependence, cigarettes, uncomplicated: Secondary | ICD-10-CM | POA: Diagnosis not present

## 2022-01-04 NOTE — Patient Instructions (Addendum)
Visit Information  Following are the goals we discussed today:  Current Barriers:  Unable to independently afford treatment regimen Unable to maintain control of T2DM, HLD Suboptimal therapeutic regimen for T2DM, HLD  Pharmacist Clinical Goal(s):  patient will verbalize ability to afford treatment regimen maintain control of T2DM, HLD, HTN as evidenced by GOAL A1C<7%, LDL<70, BP<130/80  adhere to plan to optimize therapeutic regimen for T2DM, HLD, HTN as evidenced by report of adherence to recommended medication management changes through collaboration with PharmD and provider.   Interventions: 1:1 collaboration with Gwenlyn Perking, FNP regarding development and update of comprehensive plan of care as evidenced by provider attestation and co-signature Inter-disciplinary care team collaboration (see longitudinal plan of care) Comprehensive medication review performed; medication list updated in electronic medical record  Diabetes: Goal on Track (progressing): YES. Controlled-A1C 6.0%, GFR 59--Ozempic '2mg'$ , Farxiga '10mg'$  CONTINUE Ozempic '2mg'$  sq weekly Denies personal and family history of Medullary thyroid cancer (MTC) Shipment arrived on 10/20/21 to PCP office REFILLS [place] '10mg'$  daily Approved for 2023 az&me pateint assistance program Medication to ship home Escribe to [facility] pharmacy per program rules Denies hypoglycemic/hyperglycemic symptoms Discussed meal planning options and Plate method for healthy eating Avoid sugary drinks and desserts Incorporate balanced protein, non starchy veggies, 1 serving of carbohydrate with each meal Increase water intake Increase physical activity as able Current exercise: n/a  Hypertension:  Goal on Track (progressing): YES. Controlled; current treatment:norvasc '10mg'$ -->'5mg'$ , lisinopril '10mg'$ ;  Current home readings: 120-130/65-80 Denies hypotensive/hypertensive symptoms Assessed true [place] metric; continue  current regimen  Hyperlipidemia:  Goal on track: NO. Uncontrolled; current treatment: atorvastatin '20mg'$ ;  Medications previously tried: n/a  Current dietary patterns: recommended heart healthy/healthy plate method/mediterranean  Counseled on statin, diet, lifestyle--may have to increase vs add medication at f/u LDL 86 goal <70   Patient Goals/Self-Care Activities patient will:  - take medications as prescribed as evidenced by patient report and record review check glucose daily, document, and provide at future appointments check blood pressure 3x weekly or when symptomatic, document, and provide at future appointments collaborate with provider on medication access solutions target a minimum of 150 minutes of moderate intensity exercise weekly engage in dietary modifications by heart healthy, plate method, mediterranean   Plan: Telephone follow up appointment with care management team member scheduled for:  3 MONTHS  Signature Regina Eck, PharmD, BCPS Clinical Pharmacist, [place]  II Phone [phone]   Please call the care guide team at [phone] if you need to cancel or reschedule your appointment.   The patient verbalized understanding of instructions, educational materials, and care plan provided today and DECLINED offer to receive copy of patient instructions, educational materials, and care plan.

## 2022-01-06 ENCOUNTER — Ambulatory Visit (INDEPENDENT_AMBULATORY_CARE_PROVIDER_SITE_OTHER): Payer: Medicare HMO | Admitting: Family Medicine

## 2022-01-06 ENCOUNTER — Encounter: Payer: Self-pay | Admitting: Family Medicine

## 2022-01-06 VITALS — BP 102/66 | HR 101 | Temp 99.0°F | Ht 63.0 in | Wt 146.4 lb

## 2022-01-06 DIAGNOSIS — J302 Other seasonal allergic rhinitis: Secondary | ICD-10-CM | POA: Diagnosis not present

## 2022-01-06 DIAGNOSIS — J0140 Acute pansinusitis, unspecified: Secondary | ICD-10-CM

## 2022-01-06 MED ORDER — CEFDINIR 300 MG PO CAPS: 300.0000 mg | ORAL_CAPSULE | Freq: Two times a day (BID) | ORAL | 0 refills | Status: AC

## 2022-01-06 NOTE — Progress Notes (Signed)
Established Patient Office Visit  Subjective   Patient ID: Kelsey Porter, female    DOB: 05/30/1956  Age: 66 y.o. MRN: [id number]  Chief Complaint  Patient presents with   Fever    Fever   Marly reports fever x 4 days. She also reports nasal congestion, sinus drainage, cough, and facial tenderness and pressure around her eyes, nose, and cheeks. She has had 2 negative home Covid tests. She has been struggling with seasonal allergies this year and has had chronic congestion. She has been taking tylenol, claritin, and nasal spray without improvement.    Past Medical History:  Diagnosis Date   Allergy    Arthritis    Depression    Diabetes mellitus without complication ([place])    History of Bell's palsy    Hyperlipidemia    Hypertension       Review of Systems  Constitutional:  Positive for fever.     Objective:     BP 102/66   Pulse (!) 101   Temp 99 F (37.2 C) (Oral)   Ht '5\' 3"'$  (1.6 m)   Wt 146 lb 6 oz (66.4 kg)   SpO2 95%   BMI 25.93 kg/m    Physical Exam Vitals and nursing note reviewed.  Constitutional:      General: She is not in acute distress.    Appearance: She is ill-appearing. She is not toxic-appearing or diaphoretic.  HENT:     Right Ear: Tympanic membrane, ear canal and external ear normal.     Left Ear: Tympanic membrane, ear canal and external ear normal.     Nose: Congestion present.     Right Sinus: Maxillary sinus tenderness and frontal sinus tenderness present.     Left Sinus: Maxillary sinus tenderness and frontal sinus tenderness present.     Mouth/Throat:     Pharynx: No pharyngeal swelling or posterior oropharyngeal erythema.     Tonsils: No tonsillar exudate. 0 on the right. 0 on the left.  Cardiovascular:     Rate and Rhythm: Normal rate and regular rhythm.     Heart sounds: Normal heart sounds. No murmur heard. Pulmonary:     Effort: Pulmonary effort is normal. No respiratory distress.     Breath sounds: Normal breath  sounds.  Musculoskeletal:     Right lower leg: No edema.     Left lower leg: No edema.  Skin:    General: Skin is warm and dry.  Neurological:     General: No focal deficit present.     Mental Status: She is alert and oriented to person, place, and time.  Psychiatric:        Mood and Affect: Mood normal.        Behavior: Behavior normal.     No results found for any visits on 01/06/22.    The 10-year ASCVD risk score (Arnett DK, et al., 2019) is: 14%    Assessment & Plan:   Kimla was seen today for fever.  Diagnoses and all orders for this visit:  Acute non-recurrent pansinusitis Cefdinir as below. Discussed symptomatic care and return precautions.  -     cefdinir (OMNICEF) 300 MG capsule; Take 1 capsule (300 mg total) by mouth 2 (two) times daily for 7 days. 1 po BID  Chronic seasonal allergic rhinitis Not well controlled. Continue claritin and nasal spray. Declined referral to allergist today.   Return if symptoms worsen or fail to improve.   The patient indicates understanding of these issues  and agrees with the plan.   Gwenlyn Perking, FNP

## 2022-01-19 ENCOUNTER — Telehealth: Payer: Self-pay | Admitting: *Deleted

## 2022-01-19 NOTE — Telephone Encounter (Signed)
Contacted patient to inform that we have received four boxes of Ozempic and these will be placed in the refrigerator for her to pick up.

## 2022-01-23 ENCOUNTER — Telehealth: Payer: Self-pay

## 2022-01-23 NOTE — Chronic Care Management (AMB) (Signed)
  Chronic Care Management Note  01/23/2022 Name: Kelsey Porter MRN: [id number] DOB: [birth year]  Kelsey Porter is a 66 y.o. year old female who is a primary care patient of Gwenlyn Perking, FNP and is actively engaged with the care management team. I reached out to [name] by phone today to assist with scheduling a follow up visit with the Pharmacist  Follow up plan: Unsuccessful telephone outreach attempt made. A HIPAA compliant phone message was left for the patient providing contact information and requesting a return call.  The care management team will reach out to the patient again over the next 7 days.  If patient returns call to provider office, please advise to call [facility]  at [place], [employer], [place], [place] [address] Direct Dial: [phone] [email or web address]: [email or web address]

## 2022-01-31 ENCOUNTER — Other Ambulatory Visit: Payer: Self-pay | Admitting: Family Medicine

## 2022-01-31 DIAGNOSIS — E1165 Type 2 diabetes mellitus with hyperglycemia: Secondary | ICD-10-CM

## 2022-01-31 DIAGNOSIS — E1159 Type 2 diabetes mellitus with other circulatory complications: Secondary | ICD-10-CM

## 2022-01-31 DIAGNOSIS — E1169 Type 2 diabetes mellitus with other specified complication: Secondary | ICD-10-CM

## 2022-03-07 ENCOUNTER — Encounter: Payer: Medicare HMO | Admitting: Family Medicine

## 2022-03-08 ENCOUNTER — Ambulatory Visit (INDEPENDENT_AMBULATORY_CARE_PROVIDER_SITE_OTHER): Payer: Medicare HMO | Admitting: Family Medicine

## 2022-03-08 ENCOUNTER — Ambulatory Visit (INDEPENDENT_AMBULATORY_CARE_PROVIDER_SITE_OTHER): Payer: Medicare HMO

## 2022-03-08 ENCOUNTER — Encounter: Payer: Self-pay | Admitting: Family Medicine

## 2022-03-08 VITALS — BP 135/78 | HR 90 | Temp 98.2°F | Ht 63.0 in | Wt 145.0 lb

## 2022-03-08 DIAGNOSIS — E1169 Type 2 diabetes mellitus with other specified complication: Secondary | ICD-10-CM

## 2022-03-08 DIAGNOSIS — Z780 Asymptomatic menopausal state: Secondary | ICD-10-CM

## 2022-03-08 DIAGNOSIS — E1159 Type 2 diabetes mellitus with other circulatory complications: Secondary | ICD-10-CM

## 2022-03-08 DIAGNOSIS — Z0001 Encounter for general adult medical examination with abnormal findings: Secondary | ICD-10-CM

## 2022-03-08 DIAGNOSIS — F339 Major depressive disorder, recurrent, unspecified: Secondary | ICD-10-CM

## 2022-03-08 DIAGNOSIS — E1165 Type 2 diabetes mellitus with hyperglycemia: Secondary | ICD-10-CM | POA: Diagnosis not present

## 2022-03-08 DIAGNOSIS — E785 Hyperlipidemia, unspecified: Secondary | ICD-10-CM

## 2022-03-08 DIAGNOSIS — Z0000 Encounter for general adult medical examination without abnormal findings: Secondary | ICD-10-CM

## 2022-03-08 DIAGNOSIS — I152 Hypertension secondary to endocrine disorders: Secondary | ICD-10-CM

## 2022-03-08 DIAGNOSIS — F17200 Nicotine dependence, unspecified, uncomplicated: Secondary | ICD-10-CM

## 2022-03-08 DIAGNOSIS — Z23 Encounter for immunization: Secondary | ICD-10-CM

## 2022-03-08 LAB — BAYER DCA HB A1C WAIVED: HB A1C (BAYER DCA - WAIVED): 6.1 % — ABNORMAL HIGH (ref 4.8–5.6)

## 2022-03-08 NOTE — Progress Notes (Signed)
Subjective:    Kelsey Porter is a 66 y.o. female who presents for a Welcome to Medicare exam and chronic follow up.    She reports doing well. Her bloods sugars have been averaging 140. She continues to take ozempic and farxiga. She is denies side effects of this. Reports BP is well controlled at home. She continues to take her statin without side effects. She has been eating an overall  healthy diet and has been active and has started a martial arts class.      03/08/2022   10:24 AM 01/06/2022    8:30 AM 05/05/2021    8:10 AM  Depression screen PHQ 2/9  Decreased Interest 0 0 0  Down, Depressed, Hopeless 0 0 0  PHQ - 2 Score 0 0 0  Altered sleeping 0 0 1  Tired, decreased energy 0 0 0  Change in appetite 0 0 0  Feeling bad or failure about yourself  0 0 0  Trouble concentrating 0 0 0  Moving slowly or fidgety/restless 0 0 0  Suicidal thoughts 0 0 0  PHQ-9 Score 0 0 1  Difficult doing work/chores Not difficult at all Not difficult at all Not difficult at all      03/08/2022   10:24 AM 01/06/2022    8:30 AM 05/05/2021    8:10 AM 02/03/2021    9:06 AM  GAD 7 : Generalized Anxiety Score  Nervous, Anxious, on Edge 1 0 0 1  Control/stop worrying 0 0 0 1  Worry too much - different things 0 0 0 1  Trouble relaxing 0 0 0 1  Restless 0 0 0 0  Easily annoyed or irritable 0 0 0 0  Afraid - awful might happen 0 0 0 0  Total GAD 7 Score 1 0 0 4  Anxiety Difficulty Not difficult at all Not difficult at all Not difficult at all Not difficult at all     Review of Systems Review of Systems  Constitutional:  Negative for chills, diaphoresis, fever, malaise/fatigue and weight loss.  HENT:  Negative for congestion.   Eyes:  Negative for double vision.  Respiratory:  Negative for shortness of breath.   Cardiovascular:  Negative for chest pain, palpitations, claudication, leg swelling and PND.  Gastrointestinal:  Negative for abdominal pain, nausea and vomiting.  Musculoskeletal:   Negative for falls.  Neurological:  Negative for dizziness, tremors, sensory change, speech change, focal weakness, seizures, loss of consciousness, weakness and headaches.  Endo/Heme/Allergies:  Negative for polydipsia.  Psychiatric/Behavioral:  Negative for depression, memory loss and suicidal ideas.     Cardiac Risk Factors include: advanced age (>95mn, >>48women);diabetes mellitus;dyslipidemia;hypertension      Objective:    Today's Vitals   03/08/22 1036 03/08/22 1040  BP: (!) 152/87 135/78  Pulse: 90   Temp: 98.2 F (36.8 C)   TempSrc: Temporal   SpO2: 95%   Weight: 145 lb (65.8 kg)   Height: _0  (1.6 m)   Body mass index is 25.69 kg/m.  Medications Outpatient Encounter Medications as of 03/08/2022  Medication Sig   amLODipine (NORVASC) 5 MG tablet Take 1 tablet (5 mg total) by mouth daily.   atorvastatin (LIPITOR) 20 MG tablet Take 1 tablet (20 mg total) by mouth daily.   dapagliflozin propanediol (FARXIGA) 10 MG TABS tablet Take 1 tablet (10 mg total) by mouth daily.   EPINEPHrine 0.3 mg/0.3 mL IJ SOAJ injection Inject 0.3 mg into the muscle as needed for anaphylaxis.  FLUoxetine (PROZAC) 40 MG capsule Take 1 capsule (40 mg total) by mouth daily.   lisinopril (ZESTRIL) 10 MG tablet Take 1 tablet (10 mg total) by mouth daily.   Melatonin 10 MG TABS Take 10 mg by mouth at bedtime.   Semaglutide, 2 MG/DOSE, (OZEMPIC, 2 MG/DOSE,) 8 MG/3ML SOPN Inject 2 mg into the skin once a week.   [DISCONTINUED] Turmeric 500 MG TABS Take 500 mg by mouth daily.   No facility-administered encounter medications on file as of 03/08/2022.     History: Past Medical History:  Diagnosis Date   Allergy    Arthritis    Depression    Diabetes mellitus without complication ([place])    History of Bell's palsy    Hyperlipidemia    Hypertension    Past Surgical History:  Procedure Laterality Date   BACK SURGERY  2002   COLON SURGERY     had part of her colon removed   COLONOSCOPY WITH  PROPOFOL N/A 11/29/2020   Procedure: COLONOSCOPY WITH PROPOFOL;  Surgeon: Eloise Harman, DO;  [facility];  Service: Endoscopy;  Laterality: N/A;  AM-diabetic   POLYPECTOMY  11/29/2020   Procedure: POLYPECTOMY;  Surgeon: Eloise Harman, DO;  Location: [facility];  Service: Endoscopy;;    Family History  Problem Relation Age of Onset   Alcohol abuse Mother    Arthritis Mother    COPD Mother    Diabetes Mother    Heart disease Mother    Hyperlipidemia Mother    Hypertension Mother    Kidney disease Mother    Cancer Father        lung   Diabetes Sister    Arthritis Maternal Grandmother    Diabetes Maternal Grandmother    Stroke Maternal Grandmother    Anxiety disorder Brother    Diabetes Brother    Colon cancer Neg Hx    Social History   Occupational History   Occupation: Retired  Tobacco Use   Smoking status: Every Day    Packs/day: 1.00    Years: 40.00    Total pack years: 40.00    Types: Cigarettes   Smokeless tobacco: Never  Vaping Use   Vaping Use: Never used  Substance and Sexual Activity   Alcohol use: Never   Drug use: Never   Sexual activity: Yes    Birth control/protection: Post-menopausal    Tobacco Counseling Ready to quit: No Counseling given: No   Immunizations and Health Maintenance Immunization History  Administered Date(s) Administered   Influenza Split 05/03/2015   Influenza,inj,Quad PF,6+ Mos 05/05/2021   Influenza-Unspecified 07/05/2020   Moderna Sars-Covid-2 Vaccination 11/30/2019, 12/27/2019, 07/19/2020   Pneumococcal Polysaccharide-23 02/28/2015   Tdap 06/19/2017   Health Maintenance Due  Topic Date Due   Pneumonia Vaccine 46+ Years old (2 - PCV) 02/28/2016   DEXA SCAN  Never done   MAMMOGRAM  09/01/2021   FOOT EXAM  11/01/2021   HEMOGLOBIN A1C  11/01/2021    Activities of Daily Living    03/08/2022   10:47 AM  In your present state of health, do you have any difficulty performing the following activities:   Hearing? 0  Vision? 1  Comment Wears rx glasses all the time  Difficulty concentrating or making decisions? 0  Walking or climbing stairs? 0  Dressing or bathing? 0  Doing errands, shopping? 0  Preparing Food and eating ? N  Using the Toilet? N  In the past six months, have you accidently leaked urine?  N  Do you have problems with loss of bowel control? N  Managing your Medications? N  Managing your Finances? N  Housekeeping or managing your Housekeeping? N    Physical Exam  or other factors deemed appropriate based on the beneficiary's medical and social history and current clinical standards.  Physical Exam Vitals and nursing note reviewed.  Constitutional:      General: She is not in acute distress.    Appearance: She is not ill-appearing, toxic-appearing or diaphoretic.  Eyes:     General: No scleral icterus.    Pupils: Pupils are equal, round, and reactive to light.  Neck:     Vascular: No carotid bruit.  Cardiovascular:     Rate and Rhythm: Normal rate and regular rhythm.     Heart sounds: Normal heart sounds. No murmur heard. Pulmonary:     Effort: Pulmonary effort is normal. No respiratory distress.     Breath sounds: Normal breath sounds.  Abdominal:     General: Bowel sounds are normal. There is no distension.     Palpations: Abdomen is soft.     Tenderness: There is no abdominal tenderness. There is no guarding or rebound.  Musculoskeletal:     Right lower leg: No edema.     Left lower leg: No edema.  Lymphadenopathy:     Cervical: No cervical adenopathy.  Skin:    General: Skin is warm and dry.  Neurological:     General: No focal deficit present.     Mental Status: She is alert and oriented to person, place, and time.     Motor: No weakness.     Gait: Gait normal.  Psychiatric:        Mood and Affect: Mood normal.        Behavior: Behavior normal.        Thought Content: Thought content normal.        Judgment: Judgment normal.     Advanced  Directives: Does Patient Have a Medical Advance Directive?: No Would patient like information on creating a medical advance directive?: Yes (MAU/Ambulatory/Procedural Areas - Information given)    Assessment:    This is a routine wellness examination for this patient .    Vision/Hearing screen No results found.  Dietary issues and exercise activities discussed:  Current Exercise Habits: Home exercise routine, Type of exercise: walking (swimming), Time (Minutes): 45, Frequency (Times/Week): 5, Weekly Exercise (Minutes/Week): 225, Intensity: Moderate, Exercise limited by: None identified   Goals       Exercise 150 min/wk Moderate Activity      T2DM, HLD, HTN PHARMD GOAL (pt-stated)      Current Barriers:  Unable to independently afford treatment regimen Unable to maintain control of T2DM, HLD Suboptimal therapeutic regimen for T2DM, HLD  Pharmacist Clinical Goal(s):  patient will verbalize ability to afford treatment regimen maintain control of T2DM, HLD, HTN as evidenced by GOAL A1C<7%, LDL<70, BP<130/80  adhere to plan to optimize therapeutic regimen for T2DM, HLD, HTN as evidenced by report of adherence to recommended medication management changes through collaboration with PharmD and provider.   Interventions: 1:1 collaboration with Gwenlyn Perking, FNP regarding development and update of comprehensive plan of care as evidenced by provider attestation and co-signature Inter-disciplinary care team collaboration (see longitudinal plan of care) Comprehensive medication review performed; medication list updated in electronic medical record  Diabetes: Goal on Track (progressing): YES. Controlled-A1C 6.0%, GFR 59--Ozempic 84m, Farxiga 168mCONTINUE Ozempic 66m61mq weekly Denies personal and family  history of Medullary thyroid cancer (MTC) Shipment arrived on 10/20/21 to PCP office REFILLS SENT TODAY VIA FAX CONTINUE Farxiga 64m daily Approved for 2023 az&me pateint assistance  program Medication to ship home Escribe to [facility] pharmacy per program rules Denies hypoglycemic/hyperglycemic symptoms Discussed meal planning options and Plate method for healthy eating Avoid sugary drinks and desserts Incorporate balanced protein, non starchy veggies, 1 serving of carbohydrate with each meal Increase water intake Increase physical activity as able Current exercise: n/a  Hypertension:  Goal on Track (progressing): YES. Controlled; current treatment:norvasc 134m->5mg, lisinopril 1068m Current home readings: 120-130/65-80 Denies hypotensive/hypertensive symptoms Assessed true norSilver [place]; continue current regimen  Hyperlipidemia:  Goal on track: NO. Uncontrolled; current treatment: atorvastatin 23m60mMedications previously tried: n/a  Current dietary patterns: recommended heart healthy/healthy plate method/mediterranean  Counseled on statin, diet, lifestyle--may have to increase vs add medication at f/u LDL 86 goal <70   Patient Goals/Self-Care Activities patient will:  - take medications as prescribed as evidenced by patient report and record review check glucose daily, document, and provide at future appointments check blood pressure 3x weekly or when symptomatic, document, and provide at future appointments collaborate with provider on medication access solutions target a minimum of 150 minutes of moderate intensity exercise weekly engage in dietary modifications by heart healthy, plate method, mediterranean       Depression Screen    03/08/2022   10:24 AM 01/06/2022    8:30 AM 05/05/2021    8:10 AM 02/03/2021    9:06 AM  PHQ 2/9 Scores  PHQ - 2 Score 0 0 0 2  PHQ- 9 Score 0 0 1 4     Fall Risk    03/08/2022   10:24 AM  Fall Risk   Falls in the past year? 0    Cognitive Function:    03/08/2022   10:41 AM  MMSE - Mini Mental State Exam  Orientation to time 5  Orientation to Place 5  Registration 3  Attention/ Calculation 5  Recall 1   Language- name 2 objects 2  Language- repeat 1  Language- follow 3 step command 3  Language- read & follow direction 1  Write a sentence 1  Copy design 1  Total score 28        Patient Care Team: MorgGwenlyn PerkingP as PCP - General (Family Medicine) PruiLavera GuiseH [facility] Knoll[facility] LLCPharmacist (Family Medicine) CarvEloise Harman as Consulting Physician (Internal Medicine)     Plan:   SusaLorenia seen today for medicare wellness.  Diagnoses and all orders for this visit:  Encounter for Medicare annual wellness exam  Welcome to Medicare preventive visit Reviewed EKG today- sinus rhythm.  -     EKG 12-Lead  Tobacco use disorder -     CT CHEST LUNG CA SCREEN LOW DOSE W/O CM; Future  Post-menopausal -     DG [facility] DEXA; Future  Need for vaccination -     Pneumococcal conjugate vaccine 20-valent (Prevnar 20)  Type 2 diabetes mellitus with hyperglycemia, without long-term current use of insulin (HCC) Well controlled on current regimen. A1c is 6.1. continue diet, exercise, ozempic, and farxiga.  -     Bayer DCA Hb A1c Waived  Hypertension associated with diabetes (HCC)[place] controlled on current regimen. Continue Norvasc, lisinopril. Labs pending.  -     CBC with Differential/Platelet -     CMP14+EGFR  Hyperlipidemia associated with type 2 diabetes mellitus (HCC)Saritabs pending. Continue statin.  -  Lipid panel  Depression, recurrent ([place]) Well controlled with prozac.   I have personally reviewed and noted the following in the patient's chart:   Medical and social history Use of alcohol, tobacco or illicit drugs  Current medications and supplements Functional ability and status Nutritional status Physical activity Advanced directives List of other physicians Hospitalizations, surgeries, and ER visits in previous 12 months Vitals Screenings to include cognitive, depression, and falls Referrals and appointments  In addition, I have reviewed and discussed with  patient certain preventive protocols, quality metrics, and best practice recommendations. A written personalized care plan for preventive services as well as general preventive health recommendations were provided to patient.     Gwenlyn Perking, FNP 03/08/2022

## 2022-03-08 NOTE — Patient Instructions (Addendum)
Lung Cancer Screening A lung cancer screening is a test that checks for lung cancer when there are no symptoms or history of that disease. The screening is done to look for lung cancer in its very early stages. Finding cancer early improves the chances of successful treatment. It may save your life. Who should have a screening? You should be screened for lung cancer if all of these apply: You currently smoke, or you have quit smoking within the past 15 years. You are between the ages of 50 and 80 years old. Screening may be recommended up to age 80 depending on your overall health and other factors. You have a smoking history of 1 pack of cigarettes a day for 20 years or 2 packs a day for 10 years. How is screening done?  The recommended screening test is a low-dose computed tomography (LDCT) scan. This scan takes detailed images of the lungs. This allows a health care provider to look for abnormal cells. If you are at risk for lung cancer, it is recommended that you get screened once a year. Talk to your health care provider about the risks, benefits, and limitations of screening. What are the benefits of screening? Screening can find lung cancer early, before symptoms start and before it has spread outside of the lungs. The chances of curing lung cancer are greater if the cancer is diagnosed early. What are the risks of screening? The screening may show lung cancer when no cancer is present. Talk with your health care provider about what your results mean. In some cases, your health care provider may do more testing to confirm the results. The screening may not find lung cancer when it is present. You will be exposed to radiation from repeated LDCT tests, which can cause cancer in otherwise healthy people. How can I lower my risk of lung cancer? Make these lifestyle changes to lower your risk of developing lung cancer: Do not use any products that contain nicotine or tobacco. These products  include cigarettes, chewing tobacco, and vaping devices, such as e-cigarettes. If you need help quitting, ask your health care provider. Avoid secondhand smoke. Avoid exposure to radiation. Avoid exposure to radon gas. Have your home checked for radon regularly. Avoid things that cause cancer (carcinogens). Avoid living or working in places with high air pollution or diesel exhaust. Questions to ask your health care provider Am I eligible for lung cancer screening? Does my health insurance cover the cost of lung cancer screening? What happens if the lung cancer screening shows something of concern? How soon will I have results from my lung cancer screening? Is there anything that I need to do to prepare for my lung cancer screening? What happens if I decide not to have lung cancer screening? Where to find more information Ask your health care provider about the risks and benefits of screening. More information and resources are available from these organizations: American Cancer Society (ACS): www.cancer.org American Lung Association: www.lung.org National Cancer Institute: www.cancer.gov Contact a health care provider if: You start to show symptoms of lung cancer, including: A cough that will not go away. High-pitched whistling sounds when you breathe, most often when you breathe out (wheezing). Chest pain. Coughing up blood. Shortness of breath. Weight loss that cannot be explained. Constant tiredness (fatigue). Hoarse voice. Summary Lung cancer screening may find lung cancer before symptoms appear. Finding cancer early improves the chances of successful treatment. It may save your life. The recommended screening test is a low-dose   computed tomography (LDCT) scan that looks for abnormal cells in the lungs. If you are at risk for lung cancer, it is recommended that you get screened once a year. You can make lifestyle changes to lower your risk of lung cancer. Ask your health care  provider about the risks and benefits of screening. This information is not intended to replace advice given to you by your health care provider. Make sure you discuss any questions you have with your health care provider. Document Revised: 01/12/2021 Document Reviewed: 01/12/2021 Elsevier Patient Education  2023 Elsevier Inc.  

## 2022-03-09 ENCOUNTER — Other Ambulatory Visit: Payer: Self-pay | Admitting: Family

## 2022-03-09 ENCOUNTER — Other Ambulatory Visit: Payer: Self-pay | Admitting: Family Medicine

## 2022-03-09 DIAGNOSIS — I152 Hypertension secondary to endocrine disorders: Secondary | ICD-10-CM

## 2022-03-09 DIAGNOSIS — Z780 Asymptomatic menopausal state: Secondary | ICD-10-CM | POA: Diagnosis not present

## 2022-03-09 DIAGNOSIS — E1165 Type 2 diabetes mellitus with hyperglycemia: Secondary | ICD-10-CM

## 2022-03-09 DIAGNOSIS — E1169 Type 2 diabetes mellitus with other specified complication: Secondary | ICD-10-CM

## 2022-03-09 DIAGNOSIS — M85852 Other specified disorders of bone density and structure, left thigh: Secondary | ICD-10-CM | POA: Diagnosis not present

## 2022-03-09 DIAGNOSIS — M85851 Other specified disorders of bone density and structure, right thigh: Secondary | ICD-10-CM | POA: Diagnosis not present

## 2022-03-09 LAB — LIPID PANEL
Chol/HDL Ratio: 2.2 ratio (ref 0.0–4.4)
Cholesterol, Total: 200 mg/dL — ABNORMAL HIGH (ref 100–199)
HDL: 92 mg/dL (ref >39)
LDL Chol Calc (NIH): 90 mg/dL (ref 0–99)
Triglycerides: 105 mg/dL (ref 0–149)
VLDL Cholesterol Cal: 18 mg/dL (ref 5–40)

## 2022-03-09 LAB — CMP14+EGFR
ALT: 19 IU/L (ref 0–32)
AST: 20 IU/L (ref 0–40)
Albumin/Globulin Ratio: 1.6 (ref 1.2–2.2)
Albumin: 4.4 g/dL (ref 3.9–4.9)
Alkaline Phosphatase: 89 IU/L (ref 44–121)
BUN/Creatinine Ratio: 22 (ref 12–28)
BUN: 24 mg/dL (ref 8–27)
Bilirubin Total: 0.3 mg/dL (ref 0.0–1.2)
CO2: 21 mmol/L (ref 20–29)
Calcium: 9.7 mg/dL (ref 8.7–10.3)
Chloride: 100 mmol/L (ref 96–106)
Creatinine, Ser: 1.08 mg/dL — ABNORMAL HIGH (ref 0.57–1.00)
Globulin, Total: 2.8 g/dL (ref 1.5–4.5)
Glucose: 107 mg/dL — ABNORMAL HIGH (ref 70–99)
Potassium: 4.9 mmol/L (ref 3.5–5.2)
Sodium: 136 mmol/L (ref 134–144)
Total Protein: 7.2 g/dL (ref 6.0–8.5)
eGFR: 57 mL/min/{1.73_m2} — ABNORMAL LOW (ref >59)

## 2022-03-09 LAB — CBC WITH DIFFERENTIAL/PLATELET
Basophils Absolute: 0.1 10*3/uL (ref 0.0–0.2)
Basos: 1 %
EOS (ABSOLUTE): 0.1 10*3/uL (ref 0.0–0.4)
Eos: 1 %
Hematocrit: 42.9 % (ref 34.0–46.6)
Hemoglobin: 14.2 g/dL (ref 11.1–15.9)
Immature Grans (Abs): 0 10*3/uL (ref 0.0–0.1)
Immature Granulocytes: 0 %
Lymphocytes Absolute: 1.6 10*3/uL (ref 0.7–3.1)
Lymphs: 20 %
MCH: 29.7 pg (ref 26.6–33.0)
MCHC: 33.1 g/dL (ref 31.5–35.7)
MCV: 90 fL (ref 79–97)
Monocytes Absolute: 0.6 10*3/uL (ref 0.1–0.9)
Monocytes: 8 %
Neutrophils Absolute: 5.7 10*3/uL (ref 1.4–7.0)
Neutrophils: 70 %
Platelets: 333 10*3/uL (ref 150–450)
RBC: 4.78 x10E6/uL (ref 3.77–5.28)
RDW: 13.2 % (ref 11.7–15.4)
WBC: 8.2 10*3/uL (ref 3.4–10.8)

## 2022-03-20 ENCOUNTER — Telehealth: Payer: Self-pay | Admitting: Family Medicine

## 2022-03-22 NOTE — Telephone Encounter (Signed)
Pt aware and was given the number to contact for pt assistance to have it placed on auto refill.

## 2022-03-22 NOTE — Telephone Encounter (Signed)
Please let patient know:  Called to request refill (farxiga-patient assistance).  Medication rushed due to pt being out.   Medication is not on auto refill and she will need to call to request her refills [phone])

## 2022-04-26 ENCOUNTER — Telehealth: Payer: Self-pay | Admitting: Pharmacist

## 2022-04-26 NOTE — Telephone Encounter (Signed)
Please let patient know: Ozempic '2mg'$  patient assistance supply is labeled in fridge for pick up #4boxes

## 2022-04-26 NOTE — Telephone Encounter (Signed)
Pt aware.

## 2022-05-09 ENCOUNTER — Other Ambulatory Visit: Payer: Self-pay | Admitting: Family Medicine

## 2022-05-09 DIAGNOSIS — Z1231 Encounter for screening mammogram for malignant neoplasm of breast: Secondary | ICD-10-CM

## 2022-05-15 ENCOUNTER — Ambulatory Visit
Admission: RE | Admit: 2022-05-15 | Discharge: 2022-05-15 | Disposition: A | Discharge status: Home / Self Care | Payer: Medicare HMO | Source: Ambulatory Visit | Attending: Family Medicine | Admitting: Family Medicine

## 2022-05-15 DIAGNOSIS — Z1231 Encounter for screening mammogram for malignant neoplasm of breast: Secondary | ICD-10-CM | POA: Diagnosis not present

## 2022-05-17 ENCOUNTER — Other Ambulatory Visit: Payer: Self-pay | Admitting: Family Medicine

## 2022-05-17 DIAGNOSIS — R928 Other abnormal and inconclusive findings on diagnostic imaging of breast: Secondary | ICD-10-CM

## 2022-05-18 ENCOUNTER — Telehealth: Payer: Self-pay | Admitting: Family Medicine

## 2022-05-18 NOTE — Telephone Encounter (Signed)
Please call patient with mammogram results ASAP.

## 2022-05-19 NOTE — Telephone Encounter (Signed)
I spoke to pt and reviewed her mammogram with her and answered her questions regarding the need for further evaluation with the diagnostic and possible ultrasound of right breast. Pt voiced understanding.

## 2022-05-19 NOTE — Telephone Encounter (Signed)
Patient return call. ?

## 2022-05-22 ENCOUNTER — Ambulatory Visit
Admission: RE | Admit: 2022-05-22 | Discharge: 2022-05-22 | Disposition: A | Discharge status: Home / Self Care | Payer: Medicare HMO | Source: Ambulatory Visit | Attending: Family Medicine | Admitting: Family Medicine

## 2022-05-22 DIAGNOSIS — N6489 Other specified disorders of breast: Secondary | ICD-10-CM | POA: Diagnosis not present

## 2022-05-22 DIAGNOSIS — R928 Other abnormal and inconclusive findings on diagnostic imaging of breast: Secondary | ICD-10-CM

## 2022-05-22 DIAGNOSIS — R92321 Mammographic fibroglandular density, right breast: Secondary | ICD-10-CM | POA: Diagnosis not present

## 2022-06-08 ENCOUNTER — Telehealth: Payer: Self-pay

## 2022-06-08 NOTE — Telephone Encounter (Signed)
Mailed Novo Nordisk renewal application to patient home.   Kateryna Grantham L. CPhT Rx Patient Advocate  

## 2022-08-23 DIAGNOSIS — Z0100 Encounter for examination of eyes and vision without abnormal findings: Secondary | ICD-10-CM | POA: Diagnosis not present

## 2022-08-23 DIAGNOSIS — H524 Presbyopia: Secondary | ICD-10-CM | POA: Diagnosis not present

## 2022-08-23 LAB — HM DIABETES EYE EXAM

## 2022-08-25 ENCOUNTER — Telehealth: Payer: Self-pay | Admitting: Family Medicine

## 2022-08-25 NOTE — Telephone Encounter (Signed)
Patient is calling for Kelsey Porter. She would like to talk to her about Ozempic because she only has two weeks left.

## 2022-09-01 ENCOUNTER — Other Ambulatory Visit (HOSPITAL_COMMUNITY): Payer: Self-pay

## 2022-09-03 IMAGING — MG MM DIGITAL SCREENING BILAT W/ TOMO AND CAD
8 series · 8 of 24 positions shown · non-contrast
Comparison: None.

CLINICAL DATA: Screening.

EXAM:
DIGITAL SCREENING BILATERAL MAMMOGRAM WITH TOMO AND CAD

[L MLO synth-2D]
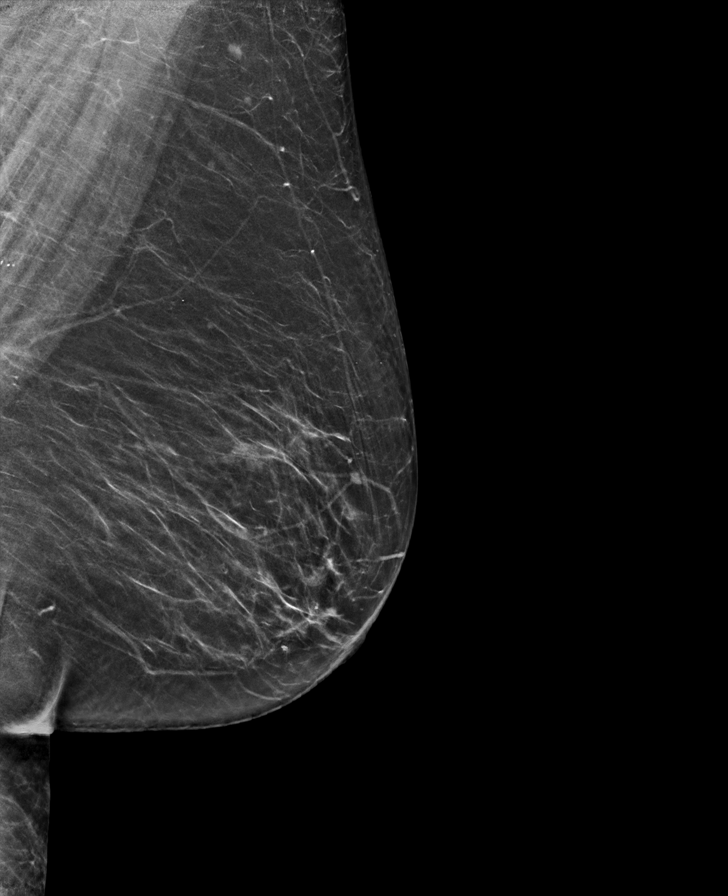

[R CC synth-2D]
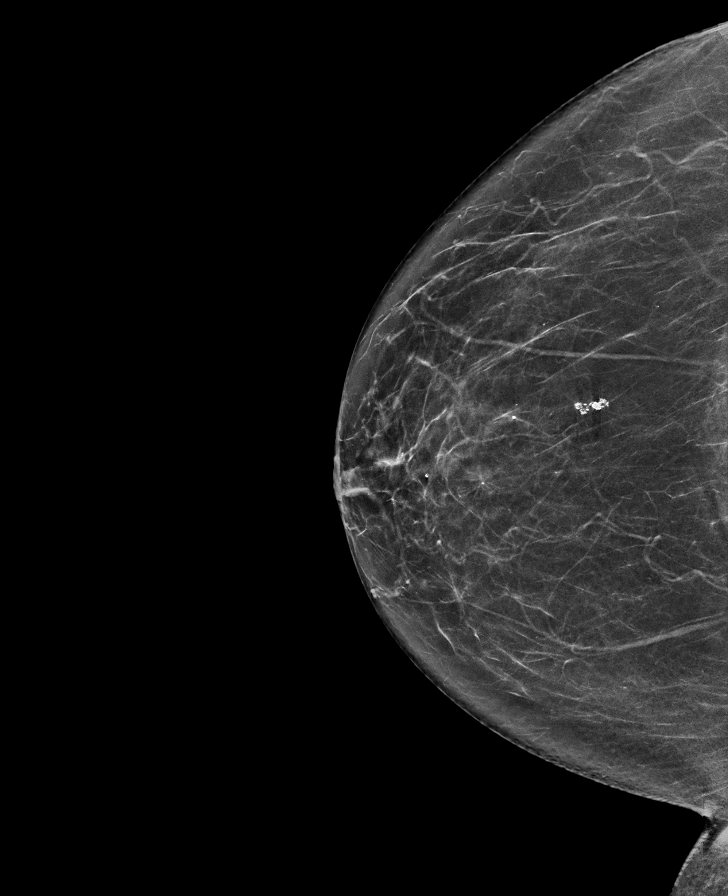

[L CC synth-2D]
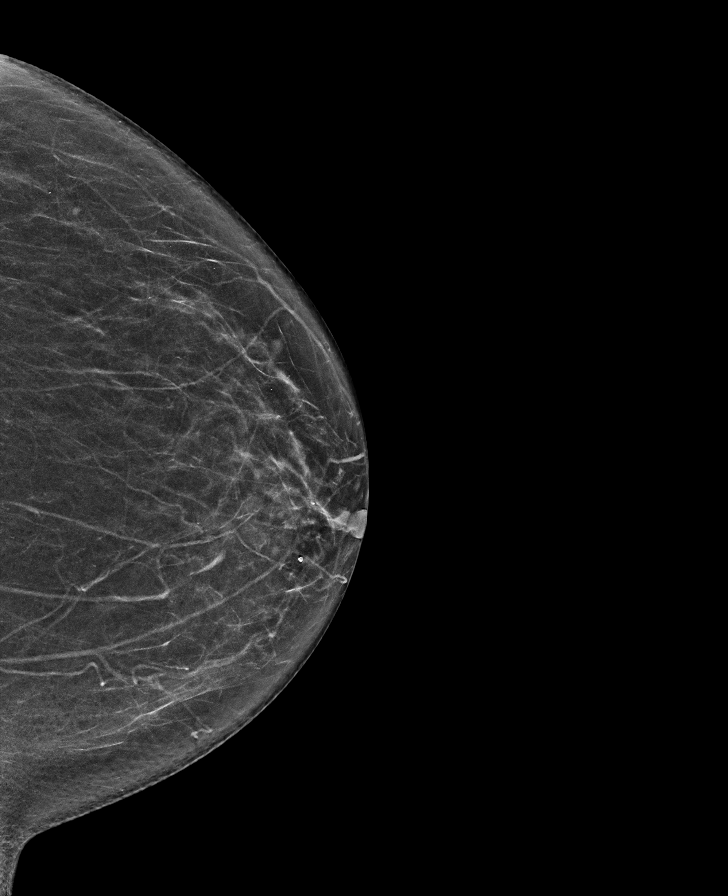

[R MLO synth-2D]
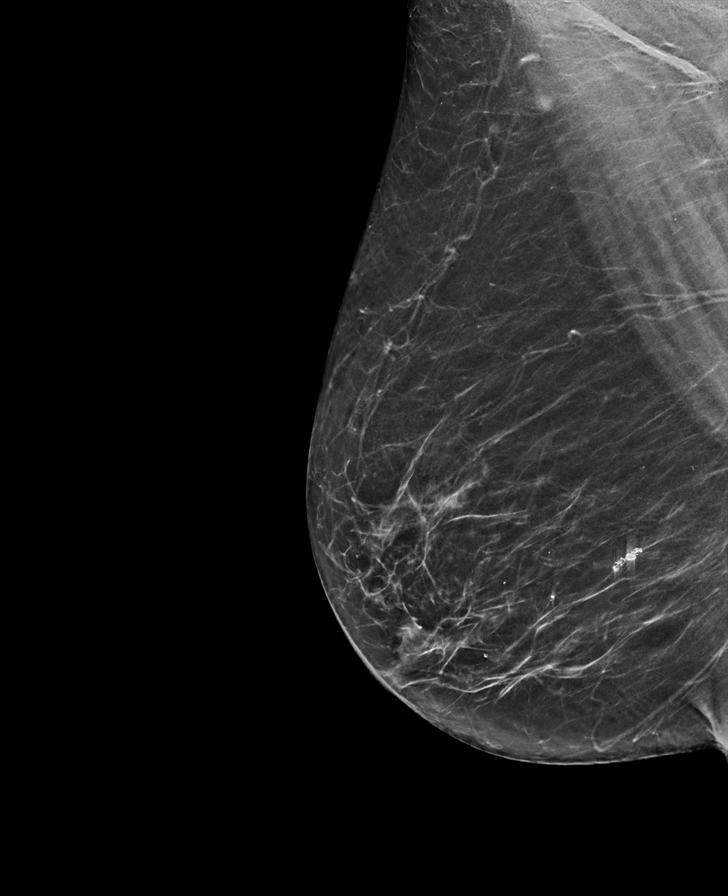

[R MLO tomo · tomo slice 37/72.0]
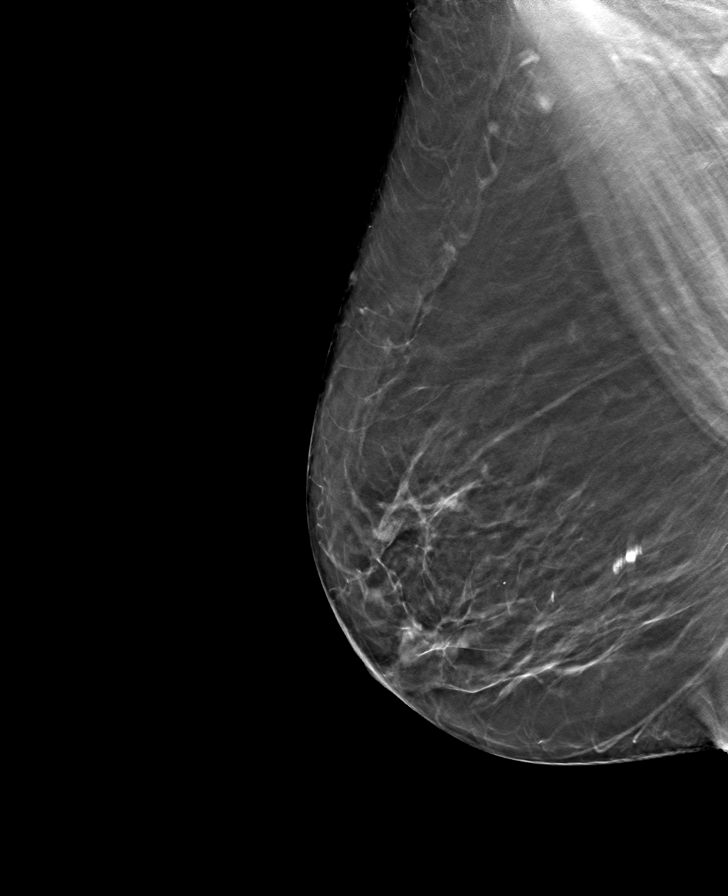

[R CC tomo · tomo slice 35/68.0]
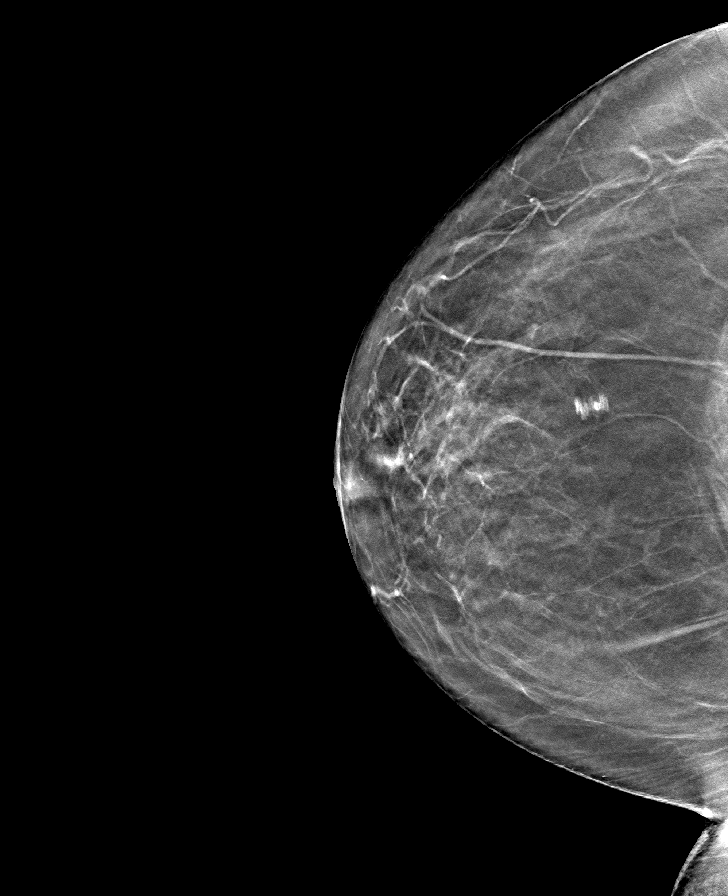

[L MLO tomo · tomo slice 35/68.0]
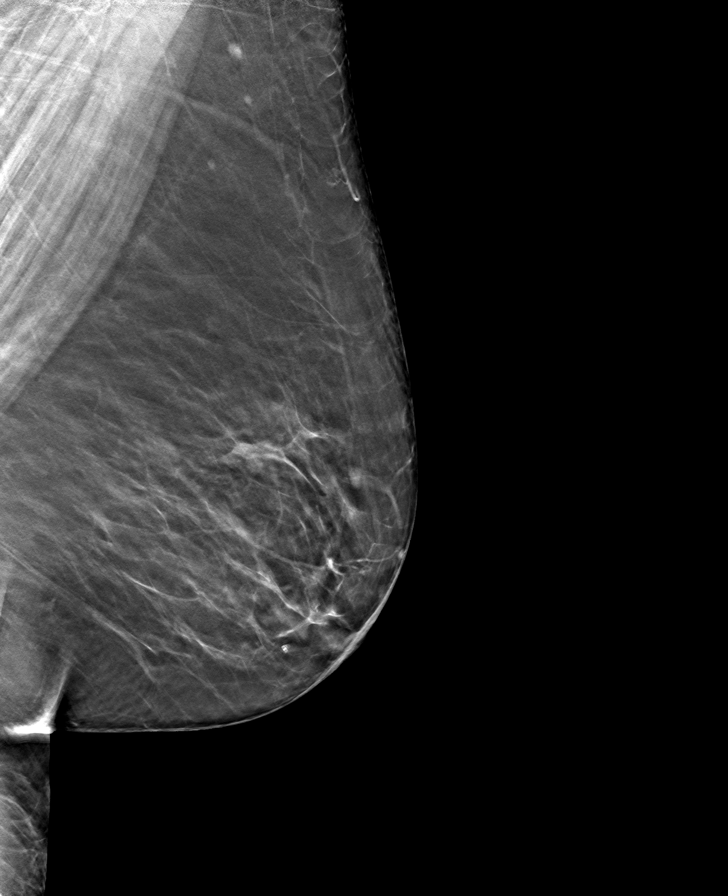

[L CC tomo · tomo slice 32/63.0]
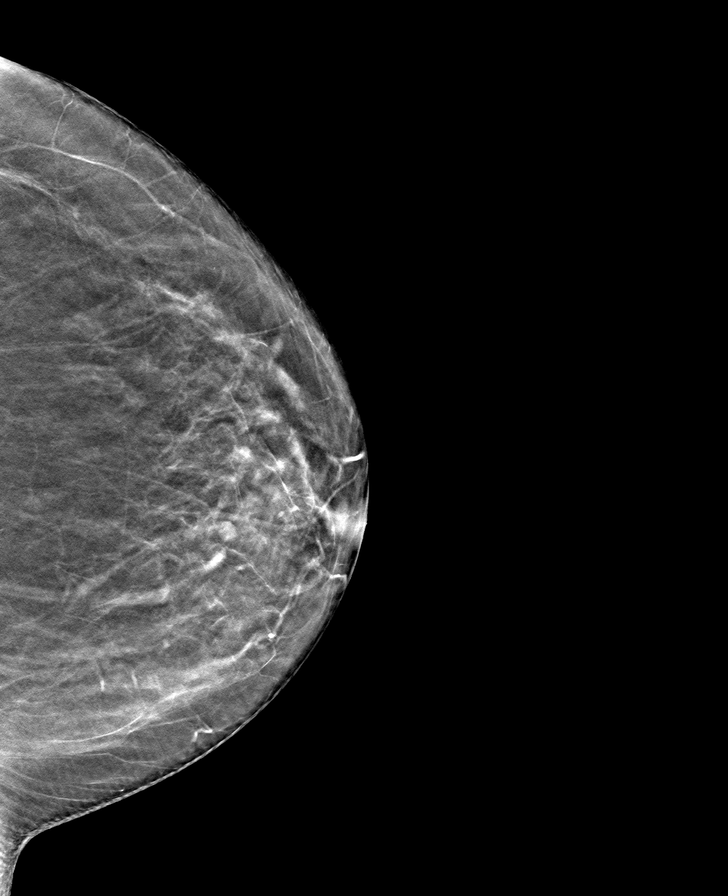

[8 of 24 positions shown; findings below may reference images not displayed]

ACR Breast Density Category b: There are scattered areas of
fibroglandular density.
FINDINGS: There are no findings suspicious for malignancy. The images were
evaluated with computer-aided detection.
IMPRESSION: No mammographic evidence of malignancy. A result letter of this
screening mammogram will be mailed directly to the patient.

RECOMMENDATION:
Screening mammogram in one year. (Code:[id number])

BI-RADS CATEGORY  1: Negative.

## 2022-09-05 NOTE — Telephone Encounter (Signed)
Left message informing patient that her renewal with novo nordisk was submitted online. She can follow up on enrollment at [phone] or call me for any questions at [phone]. Also informed her there are no samples available at the office at this time.

## 2022-09-08 ENCOUNTER — Encounter: Payer: Self-pay | Admitting: Family Medicine

## 2022-09-08 ENCOUNTER — Ambulatory Visit (INDEPENDENT_AMBULATORY_CARE_PROVIDER_SITE_OTHER): Payer: Medicare HMO | Admitting: Family Medicine

## 2022-09-08 VITALS — BP 109/65 | HR 93 | Temp 96.9°F | Ht 63.0 in | Wt 143.4 lb

## 2022-09-08 DIAGNOSIS — E785 Hyperlipidemia, unspecified: Secondary | ICD-10-CM

## 2022-09-08 DIAGNOSIS — N1831 Chronic kidney disease, stage 3a: Secondary | ICD-10-CM

## 2022-09-08 DIAGNOSIS — E1165 Type 2 diabetes mellitus with hyperglycemia: Secondary | ICD-10-CM

## 2022-09-08 DIAGNOSIS — F331 Major depressive disorder, recurrent, moderate: Secondary | ICD-10-CM | POA: Diagnosis not present

## 2022-09-08 DIAGNOSIS — Z91030 Bee allergy status: Secondary | ICD-10-CM

## 2022-09-08 DIAGNOSIS — E1159 Type 2 diabetes mellitus with other circulatory complications: Secondary | ICD-10-CM

## 2022-09-08 DIAGNOSIS — Z23 Encounter for immunization: Secondary | ICD-10-CM | POA: Diagnosis not present

## 2022-09-08 DIAGNOSIS — I129 Hypertensive chronic kidney disease with stage 1 through stage 4 chronic kidney disease, or unspecified chronic kidney disease: Secondary | ICD-10-CM | POA: Diagnosis not present

## 2022-09-08 DIAGNOSIS — E1169 Type 2 diabetes mellitus with other specified complication: Secondary | ICD-10-CM | POA: Diagnosis not present

## 2022-09-08 DIAGNOSIS — Z87892 Personal history of anaphylaxis: Secondary | ICD-10-CM | POA: Insufficient documentation

## 2022-09-08 LAB — BAYER DCA HB A1C WAIVED: HB A1C (BAYER DCA - WAIVED): 6.8 % — ABNORMAL HIGH (ref 4.8–5.6)

## 2022-09-08 MED ORDER — AMLODIPINE BESYLATE 5 MG PO TABS: 5.0000 mg | ORAL_TABLET | Freq: Every day | ORAL | 3 refills | Status: DC

## 2022-09-08 MED ORDER — FLUOXETINE HCL 40 MG PO CAPS: 40.0000 mg | ORAL_CAPSULE | Freq: Every day | ORAL | 3 refills | Status: DC

## 2022-09-08 MED ORDER — LISINOPRIL 10 MG PO TABS: 10.0000 mg | ORAL_TABLET | Freq: Every day | ORAL | 3 refills | Status: DC

## 2022-09-08 MED ORDER — DAPAGLIFLOZIN PROPANEDIOL 10 MG PO TABS: 10.0000 mg | ORAL_TABLET | Freq: Every day | ORAL | 3 refills | Status: DC

## 2022-09-08 MED ORDER — EPINEPHRINE 0.3 MG/0.3ML IJ SOAJ: 0.3000 mg | INTRAMUSCULAR | 2 refills | Status: DC | PRN

## 2022-09-08 MED ORDER — ATORVASTATIN CALCIUM 20 MG PO TABS: 20.0000 mg | ORAL_TABLET | Freq: Every day | ORAL | 3 refills | Status: DC

## 2022-09-08 NOTE — Progress Notes (Signed)
Established Patient Office Visit  Subjective   Patient ID: Kelsey Porter, female    DOB: 12-08-1955  Age: 67 y.o. MRN: [id number]  Chief Complaint  Patient presents with   Medical Management of Chronic Issues   Diabetes   Hypertension    Diabetes  Hypertension   T2DM Pt presents for follow up evaluation of Type 2 diabetes mellitus.   Patient denies foot ulcerations, increased appetite, nausea, paresthesia of the feet, polydipsia, polyuria, visual disturbances, vomiting, and weight loss.  Current diabetic medications include: farxiga 10 mg, ozempic 2 mg Compliant with meds - Yes  Current monitoring regimen: none  Weight trend: stable Current diet:  heart healthy Current exercise:  chopping wood and hauling wood  Is She on ACE inhibitor or angiotensin II receptor blocker?  Yes, lisinopril 10 mg  Is She on statin? Yes lipitor 20   2. HTN Complaint with meds - Yes Current Medications - lisinopril, amlodipine Checking BP at home - no Pertinent ROS:  Headache - No Fatigue - No Visual Disturbances - No Chest pain - No Dyspnea - No Palpitations - No LE edema - No  3. CKD On farxiga and lisinopril.   4. Depression Well controlled with prozac.      09/08/2022    8:06 AM 03/08/2022   10:24 AM 01/06/2022    8:30 AM  Depression screen PHQ 2/9  Decreased Interest 0 0 0  Down, Depressed, Hopeless 0 0 0  PHQ - 2 Score 0 0 0  Altered sleeping 0 0 0  Tired, decreased energy 0 0 0  Change in appetite 0 0 0  Feeling bad or failure about yourself  0 0 0  Trouble concentrating 0 0 0  Moving slowly or fidgety/restless 0 0 0  Suicidal thoughts 0 0 0  PHQ-9 Score 0 0 0  Difficult doing work/chores Not difficult at all Not difficult at all Not difficult at all      09/08/2022    8:07 AM 03/08/2022   10:24 AM 01/06/2022    8:30 AM 05/05/2021    8:10 AM  GAD 7 : Generalized Anxiety Score  Nervous, Anxious, on Edge 0 1 0 0  Control/stop worrying 0 0 0 0  Worry too much  - different things 0 0 0 0  Trouble relaxing 0 0 0 0  Restless 0 0 0 0  Easily annoyed or irritable 0 0 0 0  Afraid - awful might happen 0 0 0 0  Total GAD 7 Score 0 1 0 0  Anxiety Difficulty Not difficult at all Not difficult at all Not difficult at all Not difficult at all           Past Medical History:  Diagnosis Date   Allergy    Arthritis    Depression    Diabetes mellitus without complication ([place])    History of Bell's palsy    Hyperlipidemia    Hypertension       ROS As per HPI.    Objective:     BP 109/65   Pulse 93   Temp (!) 96.9 F (36.1 C) (Oral)   Ht '5\' 3"'$  (1.6 m)   Wt 143 lb 6 oz (65 kg)   SpO2 96%   BMI 25.40 kg/m  Wt Readings from Last 3 Encounters:  09/08/22 143 lb 6 oz (65 kg)  03/08/22 145 lb (65.8 kg)  01/06/22 146 lb 6 oz (66.4 kg)      Physical Exam Vitals and  nursing note reviewed.  Constitutional:      General: She is not in acute distress.    Appearance: Normal appearance. She is not ill-appearing.  Neck:     Vascular: No carotid bruit.  Cardiovascular:     Rate and Rhythm: Normal rate and regular rhythm.     Pulses: Normal pulses.     Heart sounds: Normal heart sounds. No murmur heard. Pulmonary:     Effort: Pulmonary effort is normal. No respiratory distress.     Breath sounds: Normal breath sounds.  Musculoskeletal:     Cervical back: Neck supple. No tenderness.     Right lower leg: No edema.     Left lower leg: No edema.  Lymphadenopathy:     Cervical: No cervical adenopathy.  Skin:    General: Skin is warm and dry.  Neurological:     General: No focal deficit present.     Mental Status: She is alert and oriented to person, place, and time.  Psychiatric:        Mood and Affect: Mood normal.        Behavior: Behavior normal.        Thought Content: Thought content normal.        Judgment: Judgment normal.    Diabetic Foot Exam - Simple   Simple Foot Form Diabetic Foot exam was performed with the following  findings: Yes 09/08/2022  8:45 AM  Visual Inspection No deformities, no ulcerations, no other skin breakdown bilaterally: Yes Sensation Testing Intact to touch and monofilament testing bilaterally: Yes Pulse Check Posterior Tibialis and Dorsalis pulse intact bilaterally: Yes Comments      No results found for any visits on 09/08/22.  Last CBC Lab Results  Component Value Date   WBC 8.2 03/08/2022   HGB 14.2 03/08/2022   HCT 42.9 03/08/2022   MCV 90 03/08/2022   MCH 29.7 03/08/2022   RDW 13.2 03/08/2022   PLT 333 26/20/3559   Last metabolic panel Lab Results  Component Value Date   GLUCOSE 107 (H) 03/08/2022   NA 136 03/08/2022   K 4.9 03/08/2022   CL 100 03/08/2022   CO2 21 03/08/2022   BUN 24 03/08/2022   CREATININE 1.08 (H) 03/08/2022   EGFR 57 (L) 03/08/2022   CALCIUM 9.7 03/08/2022   PROT 7.2 03/08/2022   ALBUMIN 4.4 03/08/2022   LABGLOB 2.8 03/08/2022   AGRATIO 1.6 03/08/2022   BILITOT 0.3 03/08/2022   ALKPHOS 89 03/08/2022   AST 20 03/08/2022   ALT 19 03/08/2022   ANIONGAP 9 05/19/2021   Last lipids Lab Results  Component Value Date   CHOL 200 (H) 03/08/2022   HDL 92 03/08/2022   LDLCALC 90 03/08/2022   TRIG 105 03/08/2022   CHOLHDL 2.2 03/08/2022   Last thyroid functions Lab Results  Component Value Date   TSH 2.250 08/04/2020      The 10-year ASCVD risk score (Arnett DK, et al., 2019) is: 16.5%    Assessment & Plan:   Kelsey Porter was seen today for medical management of chronic issues, diabetes and hypertension.  Diagnoses and all orders for this visit:  Type 2 diabetes mellitus with hyperglycemia, without long-term current use of insulin (HCC) A1c is 6.8 today, at goal of <7. Continue ozempic and farxiga. On ACE and statin. Diet and exercise.  -     Bayer DCA Hb A1c Waived -     Microalbumin / creatinine urine ratio -     atorvastatin (LIPITOR) 20 MG  tablet; Take 1 tablet (20 mg total) by mouth daily. -     dapagliflozin propanediol  (FARXIGA) 10 MG TABS tablet; Take 1 tablet (10 mg total) by mouth daily. -     lisinopril (ZESTRIL) 10 MG tablet; Take 1 tablet (10 mg total) by mouth daily.  Hypertension associated with diabetes ([place]) Well controlled on current regimen. Continue below.  -     amLODipine (NORVASC) 5 MG tablet; Take 1 tablet (5 mg total) by mouth daily. -     lisinopril (ZESTRIL) 10 MG tablet; Take 1 tablet (10 mg total) by mouth daily.  Hyperlipidemia associated with type 2 diabetes mellitus (HCC) Last LDL at 90. Continue liptior, diet, exercise.  -     atorvastatin (LIPITOR) 20 MG tablet; Take 1 tablet (20 mg total) by mouth daily.  Stage 3a chronic kidney disease (HCC) Stable.   Allergy to bee sting History of anaphylaxis Refill provided.  -     EPINEPHrine 0.3 mg/0.3 mL IJ SOAJ injection; Inject 0.3 mg into the muscle as needed for anaphylaxis.  Moderate episode of recurrent major depressive disorder (HCC) Well controlled on current regimen.  -     FLUoxetine (PROZAC) 40 MG capsule; Take 1 capsule (40 mg total) by mouth daily.  Return in about 6 months (around 03/09/2023).   The patient indicates understanding of these issues and agrees with the plan.  Gwenlyn Perking, FNP

## 2022-09-12 LAB — MICROALBUMIN / CREATININE URINE RATIO
Creatinine, Urine: 82.7 mg/dL
Microalb/Creat Ratio: 233 mg/g creat — ABNORMAL HIGH (ref 0–29)
Microalbumin, Urine: 192.9 ug/mL

## 2022-09-15 NOTE — Telephone Encounter (Signed)
Received notification from [place] regarding approval for [place]. Patient assistance approved from 09/08/22 to 08/07/23.  Phone: [phone]

## 2022-09-19 ENCOUNTER — Telehealth: Payer: Self-pay

## 2022-09-19 NOTE — Telephone Encounter (Signed)
Patient informed that Ozempic has been received and placed in refrigerator for pick up.

## 2022-12-18 ENCOUNTER — Telehealth: Payer: Self-pay | Admitting: Family Medicine

## 2022-12-19 NOTE — Telephone Encounter (Signed)
Left message with pt regarding novo nordisk shipment. Shipment processed 12/08/22 and should arrive to office in 10-14 business days. Gave novo nordisks phone number for future inquiries [phone]

## 2022-12-21 ENCOUNTER — Telehealth: Payer: Self-pay | Admitting: Pharmacist

## 2022-12-21 NOTE — Telephone Encounter (Signed)
Patient informed of Ozempic patient assistance supply #4 boxes of 2mg  weekly Ozempic in lab fridge for pick up. Denies personal and family history of Medullary thyroid cancer (MTC) Spoke with patient and she stated blood sugar and blood pressure are controlled.  She is now 136lbs.  Her A1c increased at bit from 6.1 to 6.8%, but she is still at goal and is aware! Medication adherence addressed

## 2023-01-09 ENCOUNTER — Encounter: Payer: Self-pay | Admitting: *Deleted

## 2023-03-09 ENCOUNTER — Ambulatory Visit: Payer: Medicare HMO | Admitting: Family Medicine

## 2023-03-09 ENCOUNTER — Encounter: Payer: Self-pay | Admitting: Family Medicine

## 2023-03-09 VITALS — BP 123/72 | HR 97 | Temp 98.2°F | Ht 63.0 in | Wt 141.0 lb

## 2023-03-09 DIAGNOSIS — E1122 Type 2 diabetes mellitus with diabetic chronic kidney disease: Secondary | ICD-10-CM

## 2023-03-09 DIAGNOSIS — E1165 Type 2 diabetes mellitus with hyperglycemia: Secondary | ICD-10-CM

## 2023-03-09 DIAGNOSIS — F17200 Nicotine dependence, unspecified, uncomplicated: Secondary | ICD-10-CM | POA: Diagnosis not present

## 2023-03-09 DIAGNOSIS — Z7984 Long term (current) use of oral hypoglycemic drugs: Secondary | ICD-10-CM

## 2023-03-09 DIAGNOSIS — E785 Hyperlipidemia, unspecified: Secondary | ICD-10-CM

## 2023-03-09 DIAGNOSIS — E1169 Type 2 diabetes mellitus with other specified complication: Secondary | ICD-10-CM | POA: Diagnosis not present

## 2023-03-09 DIAGNOSIS — I152 Hypertension secondary to endocrine disorders: Secondary | ICD-10-CM | POA: Diagnosis not present

## 2023-03-09 DIAGNOSIS — N1831 Chronic kidney disease, stage 3a: Secondary | ICD-10-CM

## 2023-03-09 DIAGNOSIS — J302 Other seasonal allergic rhinitis: Secondary | ICD-10-CM | POA: Diagnosis not present

## 2023-03-09 DIAGNOSIS — E1159 Type 2 diabetes mellitus with other circulatory complications: Secondary | ICD-10-CM

## 2023-03-09 DIAGNOSIS — N898 Other specified noninflammatory disorders of vagina: Secondary | ICD-10-CM

## 2023-03-09 DIAGNOSIS — Z7985 Long-term (current) use of injectable non-insulin antidiabetic drugs: Secondary | ICD-10-CM

## 2023-03-09 LAB — CMP14+EGFR
ALT: 17 IU/L (ref 0–32)
AST: 20 IU/L (ref 0–40)
Albumin: 4.2 g/dL (ref 3.9–4.9)
Alkaline Phosphatase: 83 IU/L (ref 44–121)
BUN/Creatinine Ratio: 28 (ref 12–28)
BUN: 32 mg/dL — ABNORMAL HIGH (ref 8–27)
Bilirubin Total: 0.3 mg/dL (ref 0.0–1.2)
CO2: 21 mmol/L (ref 20–29)
Calcium: 10 mg/dL (ref 8.7–10.3)
Chloride: 99 mmol/L (ref 96–106)
Creatinine, Ser: 1.14 mg/dL — ABNORMAL HIGH (ref 0.57–1.00)
Globulin, Total: 2.7 g/dL (ref 1.5–4.5)
Glucose: 124 mg/dL — ABNORMAL HIGH (ref 70–99)
Potassium: 4.6 mmol/L (ref 3.5–5.2)
Sodium: 135 mmol/L (ref 134–144)
Total Protein: 6.9 g/dL (ref 6.0–8.5)
eGFR: 53 mL/min/{1.73_m2} — ABNORMAL LOW (ref >59)

## 2023-03-09 LAB — CBC WITH DIFFERENTIAL/PLATELET
Basophils Absolute: 0.1 10*3/uL (ref 0.0–0.2)
Basos: 1 %
EOS (ABSOLUTE): 0.1 10*3/uL (ref 0.0–0.4)
Eos: 1 %
Hematocrit: 44.2 % (ref 34.0–46.6)
Hemoglobin: 14.5 g/dL (ref 11.1–15.9)
Immature Grans (Abs): 0 10*3/uL (ref 0.0–0.1)
Immature Granulocytes: 1 %
Lymphocytes Absolute: 1.5 10*3/uL (ref 0.7–3.1)
Lymphs: 18 %
MCH: 30.3 pg (ref 26.6–33.0)
MCHC: 32.8 g/dL (ref 31.5–35.7)
MCV: 92 fL (ref 79–97)
Monocytes Absolute: 0.6 10*3/uL (ref 0.1–0.9)
Monocytes: 6 %
Neutrophils Absolute: 6.3 10*3/uL (ref 1.4–7.0)
Neutrophils: 73 %
Platelets: 299 10*3/uL (ref 150–450)
RBC: 4.79 x10E6/uL (ref 3.77–5.28)
RDW: 11.9 % (ref 11.7–15.4)
WBC: 8.7 10*3/uL (ref 3.4–10.8)

## 2023-03-09 LAB — LIPID PANEL
Chol/HDL Ratio: 2.1 ratio (ref 0.0–4.4)
Cholesterol, Total: 194 mg/dL (ref 100–199)
HDL: 93 mg/dL (ref >39)
LDL Chol Calc (NIH): 87 mg/dL (ref 0–99)
Triglycerides: 78 mg/dL (ref 0–149)
VLDL Cholesterol Cal: 14 mg/dL (ref 5–40)

## 2023-03-09 LAB — BAYER DCA HB A1C WAIVED: HB A1C (BAYER DCA - WAIVED): 7 % — ABNORMAL HIGH (ref 4.8–5.6)

## 2023-03-09 LAB — WET PREP FOR TRICH, YEAST, CLUE
Clue Cell Exam: POSITIVE — AB
Trichomonas Exam: NEGATIVE
Yeast Exam: NEGATIVE

## 2023-03-09 LAB — VITAMIN D 25 HYDROXY (VIT D DEFICIENCY, FRACTURES)

## 2023-03-09 MED ORDER — FLUTICASONE PROPIONATE 50 MCG/ACT NA SUSP: 2.0000 | Freq: Every day | NASAL | 6 refills | Status: DC

## 2023-03-09 MED ORDER — METRONIDAZOLE 500 MG PO TABS: 500.0000 mg | ORAL_TABLET | Freq: Three times a day (TID) | ORAL | 0 refills | Status: AC

## 2023-03-09 NOTE — Progress Notes (Signed)
Established Patient Office Visit  Subjective   Patient ID: Kelsey Porter, female    DOB: 29-Aug-1955  Age: 67 y.o. MRN: [id number]  Chief Complaint  Patient presents with   Medical Management of Chronic Issues   Diabetes   Hypertension    HPI T2DM Pt presents for follow up evaluation of Type 2 diabetes mellitus.   Patient denies foot ulcerations, increased appetite, nausea, paresthesia of the feet, polydipsia, polyuria, visual disturbances, vomiting, and weight loss.  Current diabetic medications include: farxiga 10 mg, ozempic 2 mg Compliant with meds - Yes  Current monitoring regimen: occasionally, last time she checked was 143  Weight trend: stable Current diet: not great lately Current exercise: active with grandchildren  Is She on ACE inhibitor or angiotensin II receptor blocker?  Yes, lisinopril 10 mg  Is She on statin? Yes lipitor 20   2. HTN Complaint with meds - Yes Current Medications - lisinopril, amlodipine Checking BP at home - no Pertinent ROS:  Headache - No Fatigue - No Visual Disturbances - No Chest pain - No Dyspnea - No Palpitations - No LE edema - No  3. CKD On farxiga and lisinopril.   4. Depression Well controlled with prozac.   5. Tobacco use 40 pack years, current smoker. Never got a call to schedule CT lung cancer screening.  6. Vaginal discharge Vaginal discharge 2 weeks- white. Some itching. Not sexually active.   7. Allergies Continues to have daily nasal congestion, watery eyes, postnasal drip. Takes zyrtec daily. Symptoms worse in the morning.      03/09/2023    8:05 AM 03/09/2023    8:04 AM 09/08/2022    8:06 AM  Depression screen PHQ 2/9  Decreased Interest 0 0 0  Down, Depressed, Hopeless 0 0 0  PHQ - 2 Score 0 0 0  Altered sleeping 0 0 0  Tired, decreased energy 0 0 0  Change in appetite 0 0 0  Feeling bad or failure about yourself  0 0 0  Trouble concentrating 0 0 0  Moving slowly or fidgety/restless 0 0 0   Suicidal thoughts 0 0 0  PHQ-9 Score 0 0 0  Difficult doing work/chores Not difficult at all Not difficult at all Not difficult at all      03/09/2023    8:05 AM 09/08/2022    8:07 AM 03/08/2022   10:24 AM 01/06/2022    8:30 AM  GAD 7 : Generalized Anxiety Score  Nervous, Anxious, on Edge 0 0 1 0  Control/stop worrying 0 0 0 0  Worry too much - different things 0 0 0 0  Trouble relaxing 0 0 0 0  Restless 0 0 0 0  Easily annoyed or irritable 0 0 0 0  Afraid - awful might happen 0 0 0 0  Total GAD 7 Score 0 0 1 0  Anxiety Difficulty Not difficult at all Not difficult at all Not difficult at all Not difficult at all           Past Medical History:  Diagnosis Date   Allergy    Arthritis    Depression    Diabetes mellitus without complication (HCC)    History of Bell's palsy    Hyperlipidemia    Hypertension       ROS As per HPI.    Objective:     BP 123/72   Pulse 97   Temp 98.2 F (36.8 C) (Temporal)   Ht 5\' 3"  (1.6 m)  Wt 141 lb (64 kg)   SpO2 96%   BMI 24.98 kg/m  Wt Readings from Last 3 Encounters:  03/09/23 141 lb (64 kg)  09/08/22 143 lb 6 oz (65 kg)  03/08/22 145 lb (65.8 kg)      Physical Exam Vitals and nursing note reviewed.  Constitutional:      General: She is not in acute distress.    Appearance: Normal appearance. She is not ill-appearing.  Neck:     Vascular: No carotid bruit.  Cardiovascular:     Rate and Rhythm: Normal rate and regular rhythm.     Pulses: Normal pulses.     Heart sounds: Normal heart sounds. No murmur heard. Pulmonary:     Effort: Pulmonary effort is normal. No respiratory distress.     Breath sounds: Normal breath sounds.  Musculoskeletal:     Cervical back: Neck supple. No tenderness.     Right lower leg: No edema.     Left lower leg: No edema.  Lymphadenopathy:     Cervical: No cervical adenopathy.  Skin:    General: Skin is warm and dry.  Neurological:     General: No focal deficit present.     Mental  Status: She is alert and oriented to person, place, and time.  Psychiatric:        Mood and Affect: Mood normal.        Behavior: Behavior normal.        Thought Content: Thought content normal.        Judgment: Judgment normal.     Microscopic wet-mount exam shows clue cells, few bacteria.   No results found for any visits on 03/09/23.  Last CBC Lab Results  Component Value Date   WBC 8.2 03/08/2022   HGB 14.2 03/08/2022   HCT 42.9 03/08/2022   MCV 90 03/08/2022   MCH 29.7 03/08/2022   RDW 13.2 03/08/2022   PLT 333 03/08/2022   Last metabolic panel Lab Results  Component Value Date   GLUCOSE 107 (H) 03/08/2022   NA 136 03/08/2022   K 4.9 03/08/2022   CL 100 03/08/2022   CO2 21 03/08/2022   BUN 24 03/08/2022   CREATININE 1.08 (H) 03/08/2022   EGFR 57 (L) 03/08/2022   CALCIUM 9.7 03/08/2022   PROT 7.2 03/08/2022   ALBUMIN 4.4 03/08/2022   LABGLOB 2.8 03/08/2022   AGRATIO 1.6 03/08/2022   BILITOT 0.3 03/08/2022   ALKPHOS 89 03/08/2022   AST 20 03/08/2022   ALT 19 03/08/2022   ANIONGAP 9 05/19/2021   Last lipids Lab Results  Component Value Date   CHOL 200 (H) 03/08/2022   HDL 92 03/08/2022   LDLCALC 90 03/08/2022   TRIG 105 03/08/2022   CHOLHDL 2.2 03/08/2022   Last thyroid functions Lab Results  Component Value Date   TSH 2.250 08/04/2020      The 10-year ASCVD risk score (Arnett DK, et al., 2019) is: 20.6%    Assessment & Plan:   Kelsey Porter was seen today for medical management of chronic issues, diabetes and hypertension.  Diagnoses and all orders for this visit:  Type 2 diabetes mellitus with hyperglycemia, without long-term current use of insulin (HCC) A1c 7.0 today, not at goal of <7. Medication changes today: none. Discussed diet control. She is on an ACE/ARB and statin. Eye exam: UTD. Foot exam: UTD. Urine micro: today. Diet and exercise.  -     Bayer DCA Hb A1c Waived -     Microalbumin /  creatinine urine ratio  Hypertension associated  with diabetes (HCC) Well controlled on current regimen.  -     CMP14+EGFR -     CBC with Differential/Platelet  Hyperlipidemia associated with type 2 diabetes mellitus (HCC) Fasting lipid panel pending.  -     Lipid panel  Stage 3a chronic kidney disease (HCC) Labs pending. Avoid NSAIDs. On ACE and farxiga.  -     VITAMIN D 25 Hydroxy (Vit-D Deficiency, Fractures)  Vaginal discharge Flagyl as below.  -     WET PREP FOR TRICH, YEAST, CLUE -     metroNIDAZOLE (FLAGYL) 500 MG tablet; Take 1 tablet (500 mg total) by mouth 3 (three) times daily for 7 days.  Tobacco use disorder 40 pack year history.  -     Ambulatory Referral Lung Cancer Screening [facility]  Chronic seasonal allergic rhinitis Add flonase.  -     fluticasone (FLONASE) 50 MCG/ACT nasal spray; Place 2 sprays into both nostrils daily.   Return in about 6 months (around 09/09/2023) for chronic follow up.   The patient indicates understanding of these issues and agrees with the plan.  Gabriel Earing, FNP

## 2023-03-12 ENCOUNTER — Other Ambulatory Visit: Payer: Self-pay | Admitting: Family Medicine

## 2023-03-13 ENCOUNTER — Ambulatory Visit (INDEPENDENT_AMBULATORY_CARE_PROVIDER_SITE_OTHER): Payer: Medicare HMO

## 2023-03-13 VITALS — Ht 63.0 in | Wt 141.0 lb

## 2023-03-13 DIAGNOSIS — Z0000 Encounter for general adult medical examination without abnormal findings: Secondary | ICD-10-CM | POA: Diagnosis not present

## 2023-03-13 NOTE — Progress Notes (Signed)
Subjective:   Kelsey Porter is a 67 y.o. female who presents for an Initial Medicare Annual Wellness Visit.  Visit Complete: Virtual  I connected with  Kelsey Porter on 03/13/23 by a audio enabled telemedicine application and verified that I am speaking with the correct person using two identifiers.  Patient Location: Home  Provider Location: Home Office  I discussed the limitations of evaluation and management by telemedicine. The patient expressed understanding and agreed to proceed.  Patient Medicare AWV questionnaire was completed by the patient on 03/13/2023; I have confirmed that all information answered by patient is correct and no changes since this date.  Review of Systems    Vital Signs: Unable to obtain new vitals due to this being a telehealth visit.  Cardiac Risk Factors include: advanced age (>16men, >62 women);diabetes mellitus;dyslipidemia;hypertension Nutrition Risk Assessment:  Has the patient had any N/V/D within the last 2 months?  No  Does the patient have any non-healing wounds?  No  Has the patient had any unintentional weight loss or weight gain?  No   Diabetes:  Is the patient diabetic?  Yes  If diabetic, was a CBG obtained today?  No  Did the patient bring in their glucometer from home?  No  How often do you monitor your CBG's? Weekly .   Financial Strains and Diabetes Management:  Are you having any financial strains with the device, your supplies or your medication? No .  Does the patient want to be seen by Chronic Care Management for management of their diabetes?  No  Would the patient like to be referred to a Nutritionist or for Diabetic Management?  No   Diabetic Exams:  Diabetic Eye Exam: Completed 08/2022 Diabetic Foot Exam: Overdue, Pt has been advised about the importance in completing this exam. Pt is scheduled for diabetic foot exam on next office visit .     Objective:    Today's Vitals   03/13/23 1331  Weight: 141 lb  (64 kg)  Height: 5\' 3"  (1.6 m)   Body mass index is 24.98 kg/m.     03/13/2023    1:35 PM 03/08/2022   10:46 AM 05/19/2021    9:12 AM 11/29/2020    8:43 AM  Advanced Directives  Does Patient Have a Medical Advance Directive? No No No No  Would patient like information on creating a medical advance directive? Yes (MAU/Ambulatory/Procedural Areas - Information given) Yes (MAU/Ambulatory/Procedural Areas - Information given) No - Patient declined Yes (MAU/Ambulatory/Procedural Areas - Information given)    Current Medications (verified) Outpatient Encounter Medications as of 03/13/2023  Medication Sig   amLODipine (NORVASC) 5 MG tablet Take 1 tablet (5 mg total) by mouth daily.   atorvastatin (LIPITOR) 20 MG tablet Take 1 tablet (20 mg total) by mouth daily.   CALCIUM PO Take by mouth.   dapagliflozin propanediol (FARXIGA) 10 MG TABS tablet Take 1 tablet (10 mg total) by mouth daily.   EPINEPHrine 0.3 mg/0.3 mL IJ SOAJ injection Inject 0.3 mg into the muscle as needed for anaphylaxis.   FLUoxetine (PROZAC) 40 MG capsule Take 1 capsule (40 mg total) by mouth daily.   fluticasone (FLONASE) 50 MCG/ACT nasal spray Place 2 sprays into both nostrils daily.   lisinopril (ZESTRIL) 10 MG tablet Take 1 tablet (10 mg total) by mouth daily.   metroNIDAZOLE (FLAGYL) 500 MG tablet Take 1 tablet (500 mg total) by mouth 3 (three) times daily for 7 days.   Semaglutide, 2 MG/DOSE, (OZEMPIC, 2 MG/DOSE,) 8  MG/3ML SOPN Inject 2 mg into the skin once a week.   No facility-administered encounter medications on file as of 03/13/2023.    Allergies (verified) Bee venom and Penicillin g   History: Past Medical History:  Diagnosis Date   Allergy    Arthritis    Depression    Diabetes mellitus without complication (HCC)    History of Bell's palsy    Hyperlipidemia    Hypertension    Past Surgical History:  Procedure Laterality Date   BACK SURGERY  2002   COLON SURGERY     had part of her colon removed    COLONOSCOPY WITH PROPOFOL N/A 11/29/2020   Procedure: COLONOSCOPY WITH PROPOFOL;  Surgeon: Lanelle Bal, DO;  [facility]: [facility];  Service: Endoscopy;  Laterality: N/A;  AM-diabetic   POLYPECTOMY  11/29/2020   Procedure: POLYPECTOMY;  Surgeon: Lanelle Bal, DO;  Location: [facility];  Service: Endoscopy;;   Family History  Problem Relation Age of Onset   Alcohol abuse Mother    Arthritis Mother    COPD Mother    Diabetes Mother    Heart disease Mother    Hyperlipidemia Mother    Hypertension Mother    Kidney disease Mother    Cancer Father        lung   Diabetes Sister    Arthritis Maternal Grandmother    Diabetes Maternal Grandmother    Stroke Maternal Grandmother    Anxiety disorder Brother    Diabetes Brother    Colon cancer Neg Hx    Breast cancer Neg Hx    Social History   Socioeconomic History   Marital status: Married    Spouse name: Casimiro Needle   Number of children: 2   Years of education: 14   Highest education level: Associate degree: occupational, Scientist, product/process development, or vocational program  Occupational History   Occupation: Retired  Tobacco Use   Smoking status: Every Day    Current packs/day: 1.00    Average packs/day: 1 pack/day for 40.0 years (40.0 ttl pk-yrs)    Types: Cigarettes   Smokeless tobacco: Never  Vaping Use   Vaping status: Never Used  Substance and Sexual Activity   Alcohol use: Never   Drug use: Never   Sexual activity: Yes    Birth control/protection: Post-menopausal  Other Topics Concern   Not on file  Social History Narrative   Not on file   Social Determinants of Health   Financial Resource Strain: Low Risk  (03/13/2023)   Overall Financial Resource Strain (CARDIA)    Difficulty of Paying Living Expenses: Not hard at all  Food Insecurity: No Food Insecurity (03/13/2023)   Hunger Vital Sign    Worried About Running Out of Food in the Last Year: Never true    Ran Out of Food in the Last Year: Never true  Transportation Needs:  No Transportation Needs (03/13/2023)   PRAPARE - Administrator, Civil Service (Medical): No    Lack of Transportation (Non-Medical): No  Physical Activity: Insufficiently Active (03/13/2023)   Exercise Vital Sign    Days of Exercise per Week: 3 days    Minutes of Exercise per Session: 30 min  Stress: No Stress Concern Present (03/13/2023)   [employer] of Occupational Health - Occupational Stress Questionnaire    Feeling of Stress : Not at all  Social Connections: Moderately Isolated (03/13/2023)   Social Connection and Isolation Panel [NHANES]    Frequency of Communication with Friends and  Family: More than three times a week    Frequency of Social Gatherings with Friends and Family: More than three times a week    Attends Religious Services: Never    Database administrator or Organizations: No    Attends Engineer, structural: Never    Marital Status: Married    Tobacco Counseling Ready to quit: Not Answered Counseling given: Not Answered   Clinical Intake:  Pre-visit preparation completed: Yes  Pain : No/denies pain     Nutritional Risks: None Diabetes: Yes CBG done?: No Did pt. bring in CBG monitor from home?: No  How often do you need to have someone help you when you read instructions, pamphlets, or other written materials from your doctor or pharmacy?: 1 - Never  Interpreter Needed?: No  Information entered by :: Renie Ora, LPN   Activities of Daily Living    03/13/2023    1:35 PM  In your present state of health, do you have any difficulty performing the following activities:  Hearing? 0  Vision? 0  Difficulty concentrating or making decisions? 0  Walking or climbing stairs? 0  Dressing or bathing? 0  Doing errands, shopping? 0  Preparing Food and eating ? N  Using the Toilet? N  In the past six months, have you accidently leaked urine? N  Do you have problems with loss of bowel control? N  Managing your Medications? N  Managing  your Finances? N  Housekeeping or managing your Housekeeping? N    Patient Care Team: Gabriel Earing, FNP as PCP - General (Family Medicine) Danella Maiers, [facility] as Pharmacist (Family Medicine) Lanelle Bal, DO as Consulting Physician (Internal Medicine)  Indicate any recent Medical Services you may have received from other than [facility] providers in the past year (date may be approximate).     Assessment:   This is a routine wellness examination for Kelsey Porter.  Hearing/Vision screen Vision Screening - Comments:: Wears rx glasses - [facility]   Dietary issues and exercise activities discussed:     Goals Addressed             This Visit's Progress    DIET - INCREASE WATER INTAKE         Depression Screen    03/13/2023    1:34 PM 03/09/2023    8:05 AM 03/09/2023    8:04 AM 09/08/2022    8:06 AM 03/08/2022   10:24 AM 01/06/2022    8:30 AM 05/05/2021    8:10 AM  PHQ 2/9 Scores  PHQ - 2 Score 0 0 0 0 0 0 0  PHQ- 9 Score 0 0 0 0 0 0 1    Fall Risk    03/13/2023    1:33 PM 03/09/2023    8:04 AM 09/08/2022    8:06 AM 03/08/2022   10:24 AM 01/06/2022    8:30 AM  Fall Risk   Falls in the past year? 0 0 0 0 0  Number falls in past yr: 0      Injury with Fall? 0      Risk for fall due to : No Fall Risks      Follow up Falls prevention discussed        MEDICARE RISK AT HOME:  Medicare Risk at Home - 03/13/23 1333     Any stairs in or around the home? Yes    If so, are there any  without handrails? No    Home free of loose throw rugs in walkways, pet beds, electrical cords, etc? Yes    Adequate lighting in your home to reduce risk of falls? Yes    Life alert? No    Use of a cane, walker or w/c? No    Grab bars in the bathroom? No    Shower chair or bench in shower? No    Elevated toilet seat or a handicapped toilet? No             TIMED UP AND GO:  Was the test performed? No    Cognitive Function:    03/08/2022   10:41 AM   MMSE - Mini Mental State Exam  Orientation to time 5  Orientation to Place 5  Registration 3  Attention/ Calculation 5  Recall 1  Language- name 2 objects 2  Language- repeat 1  Language- follow 3 step command 3  Language- read & follow direction 1  Write a sentence 1  Copy design 1  Total score 28        03/13/2023    1:35 PM  6CIT Screen  What Year? 0 points  What month? 0 points  What time? 0 points  Count back from 20 0 points  Months in reverse 0 points  Repeat phrase 0 points  Total Score 0 points    Immunizations Immunization History  Administered Date(s) Administered   Fluad Quad(high Dose 65+) 09/08/2022   Influenza Split 05/03/2015   Influenza,inj,Quad PF,6+ Mos 05/05/2021   Influenza-Unspecified 07/05/2020   Moderna Sars-Covid-2 Vaccination 11/30/2019, 12/27/2019, 07/19/2020   PNEUMOCOCCAL CONJUGATE-20 03/08/2022   Pneumococcal Polysaccharide-23 02/28/2015   Tdap 06/19/2017    TDAP status: Up to date  Flu Vaccine status: Up to date  Pneumococcal vaccine status: Up to date  Covid-19 vaccine status: Completed vaccines  Qualifies for Shingles Vaccine? Yes   Zostavax completed No   Shingrix Completed?: No.    Education has been provided regarding the importance of this vaccine. Patient has been advised to call insurance company to determine out of pocket expense if they have not yet received this vaccine. Advised may also receive vaccine at local pharmacy or Health Dept. Verbalized acceptance and understanding.  Screening Tests Health Maintenance  Topic Date Due   Hepatitis C Screening  Never done   Lung Cancer Screening  Never done   COVID-19 Vaccine (4 - 2023-24 season) 09/25/2023 (Originally 04/07/2022)   INFLUENZA VACCINE  11/05/2023 (Originally 03/08/2023)   Zoster Vaccines- Shingrix (1 of 2) 12/07/2023 (Originally 08/01/2006)   MAMMOGRAM  05/16/2023   OPHTHALMOLOGY EXAM  08/24/2023   FOOT EXAM  09/09/2023   HEMOGLOBIN A1C  09/09/2023    Diabetic kidney evaluation - eGFR measurement  03/08/2024   Diabetic kidney evaluation - Urine ACR  03/08/2024   Medicare Annual Wellness (AWV)  03/12/2024   Colonoscopy  11/29/2025   DTaP/Tdap/Td (2 - Td or Tdap) 06/20/2027   Pneumonia Vaccine 55+ Years old  Completed   DEXA SCAN  Completed   HPV VACCINES  Aged Out    Health Maintenance  Health Maintenance Due  Topic Date Due   Hepatitis C Screening  Never done   Lung Cancer Screening  Never done    Colorectal cancer screening: Type of screening: Colonoscopy. Completed 11/29/2020. Repeat every 5 years  Mammogram status: Completed 05/15/2022. Repeat every year  Bone Density status: Completed 03/09/2022. Results reflect: Bone density results: OSTEOPENIA. Repeat every 5 years.  Lung Cancer Screening: (Low  Dose CT Chest recommended if Age 34-80 years, 20 pack-year currently smoking OR have quit w/in 15years.) does qualify.   Lung Cancer Screening Referral: 03/09/2023  Additional Screening:  Hepatitis C Screening: does not qualify;  Vision Screening: Recommended annual ophthalmology exams for early detection of glaucoma and other disorders of the eye. Is the patient up to date with their annual eye exam?  Yes  Who is the provider or what is the name of the office in which the patient attends annual eye exams? [facility] in [place]  If pt is not established with a provider, would they like to be referred to a provider to establish care? No .   Dental Screening: Recommended annual dental exams for proper oral hygiene   Community Resource Referral / Chronic Care Management: CRR required this visit?  No   CCM required this visit?  No     Plan:     I have personally reviewed and noted the following in the patient's chart:   Medical and social history Use of alcohol, tobacco or illicit drugs  Current medications and supplements including opioid prescriptions. Patient is not currently taking opioid  prescriptions. Functional ability and status Nutritional status Physical activity Advanced directives List of other physicians Hospitalizations, surgeries, and ER visits in previous 12 months Vitals Screenings to include cognitive, depression, and falls Referrals and appointments  In addition, I have reviewed and discussed with patient certain preventive protocols, quality metrics, and best practice recommendations. A written personalized care plan for preventive services as well as general preventive health recommendations were provided to patient.     Lorrene Reid, LPN   09/11/9561   After Visit Summary: (MyChart) Due to this being a telephonic visit, the after visit summary with patients personalized plan was offered to patient via MyChart   Nurse Notes: none

## 2023-03-13 NOTE — Patient Instructions (Signed)
Kelsey Porter , Thank you for taking time to come for your Medicare Wellness Visit. I appreciate your ongoing commitment to your health goals. Please review the following plan we discussed and let me know if I can assist you in the future.   Referrals/Orders/Follow-Ups/Clinician Recommendations: Aim for 30 minutes of exercise or brisk walking, 6-8 glasses of water, and 5 servings of fruits and vegetables each day.   This is a list of the screening recommended for you and due dates:  Health Maintenance  Topic Date Due   Hepatitis C Screening  Never done   Screening for Lung Cancer  Never done   COVID-19 Vaccine (4 - 2023-24 season) 09/25/2023*   Flu Shot  11/05/2023*   Zoster (Shingles) Vaccine (1 of 2) 12/07/2023*   Mammogram  05/16/2023   Eye exam for diabetics  08/24/2023   Complete foot exam   09/09/2023   Hemoglobin A1C  09/09/2023   Yearly kidney function blood test for diabetes  03/08/2024   Yearly kidney health urinalysis for diabetes  03/08/2024   Medicare Annual Wellness Visit  03/12/2024   Colon Cancer Screening  11/29/2025   DTaP/Tdap/Td vaccine (2 - Td or Tdap) 06/20/2027   Pneumonia Vaccine  Completed   DEXA scan (bone density measurement)  Completed   HPV Vaccine  Aged Out  *Topic was postponed. The date shown is not the original due date.    Advanced directives: (ACP Link)Information on Advanced Care Planning can be found at [facility] [place] Advance Health Care Directives Advance Health Care Directives ([email or web address] Information on Advanced Care Planning can be found at [facility] Advance Health Care Directives Advance Health Care Directives ([email or web address]    Next Medicare Annual Wellness Visit scheduled for next year: Yes  Preventive Care 65 Years and Older, Female Preventive care refers to lifestyle choices and visits with your health care provider that can promote health and wellness. What does preventive care include? A yearly  physical exam. This is also called an annual well check. Dental exams once or twice a year. Routine eye exams. Ask your health care provider how often you should have your eyes checked. Personal lifestyle choices, including: Daily care of your teeth and gums. Regular physical activity. Eating a healthy diet. Avoiding tobacco and drug use. Limiting alcohol use. Practicing safe sex. Taking low-dose aspirin every day. Taking vitamin and mineral supplements as recommended by your health care provider. What happens during an annual well check? The services and screenings done by your health care provider during your annual well check will depend on your age, overall health, lifestyle risk factors, and family history of disease. Counseling  Your health care provider may ask you questions about your: Alcohol use. Tobacco use. Drug use. Emotional well-being. Home and relationship well-being. Sexual activity. Eating habits. History of falls. Memory and ability to understand (cognition). Work and work Astronomer. Reproductive health. Screening  You may have the following tests or measurements: Height, weight, and BMI. Blood pressure. Lipid and cholesterol levels. These may be checked every 5 years, or more frequently if you are over 64 years old. Skin check. Lung cancer screening. You may have this screening every year starting at age 61 if you have a 30-pack-year history of smoking and currently smoke or have quit within the past 15 years. Fecal occult blood test (FOBT) of the stool. You may have this test every year starting at age 19. Flexible sigmoidoscopy or colonoscopy. You may have a sigmoidoscopy every  5 years or a colonoscopy every 10 years starting at age 33. Hepatitis C blood test. Hepatitis B blood test. Sexually transmitted disease (STD) testing. Diabetes screening. This is done by checking your blood sugar (glucose) after you have not eaten for a while (fasting). You may  have this done every 1-3 years. Bone density scan. This is done to screen for osteoporosis. You may have this done starting at age 29. Mammogram. This may be done every 1-2 years. Talk to your health care provider about how often you should have regular mammograms. Talk with your health care provider about your test results, treatment options, and if necessary, the need for more tests. Vaccines  Your health care provider may recommend certain vaccines, such as: Influenza vaccine. This is recommended every year. Tetanus, diphtheria, and acellular pertussis (Tdap, Td) vaccine. You may need a Td booster every 10 years. Zoster vaccine. You may need this after age 49. Pneumococcal 13-valent conjugate (PCV13) vaccine. One dose is recommended after age 71. Pneumococcal polysaccharide (PPSV23) vaccine. One dose is recommended after age 40. Talk to your health care provider about which screenings and vaccines you need and how often you need them. This information is not intended to replace advice given to you by your health care provider. Make sure you discuss any questions you have with your health care provider. Document Released: 08/20/2015 Document Revised: 04/12/2016 Document Reviewed: 05/25/2015 Elsevier Interactive Patient Education  2017 [employer].  Fall Prevention in the Home Falls can cause injuries. They can happen to people of all ages. There are many things you can do to make your home safe and to help prevent falls. What can I do on the outside of my home? Regularly fix the edges of walkways and driveways and fix any cracks. Remove anything that might make you trip as you walk through a door, such as a raised step or threshold. Trim any bushes or trees on the path to your home. Use bright outdoor lighting. Clear any walking paths of anything that might make someone trip, such as rocks or tools. Regularly check to see if handrails are loose or broken. Make sure that both sides of any  steps have handrails. Any raised decks and porches should have guardrails on the edges. Have any leaves, snow, or ice cleared regularly. Use sand or salt on walking paths during winter. Clean up any spills in your garage right away. This includes oil or grease spills. What can I do in the bathroom? Use night lights. Install grab bars by the toilet and in the tub and shower. Do not use towel bars as grab bars. Use non-skid mats or decals in the tub or shower. If you need to sit down in the shower, use a plastic, non-slip stool. Keep the floor dry. Clean up any water that spills on the floor as soon as it happens. Remove soap buildup in the tub or shower regularly. Attach bath mats securely with double-sided non-slip rug tape. Do not have throw rugs and other things on the floor that can make you trip. What can I do in the bedroom? Use night lights. Make sure that you have a light by your bed that is easy to reach. Do not use any sheets or blankets that are too big for your bed. They should not hang down onto the floor. Have a firm chair that has side arms. You can use this for support while you get dressed. Do not have throw rugs and other things on  the floor that can make you trip. What can I do in the kitchen? Clean up any spills right away. Avoid walking on wet floors. Keep items that you use a lot in easy-to-reach places. If you need to reach something above you, use a strong step stool that has a grab bar. Keep electrical cords out of the way. Do not use floor polish or wax that makes floors slippery. If you must use wax, use non-skid floor wax. Do not have throw rugs and other things on the floor that can make you trip. What can I do with my stairs? Do not leave any items on the stairs. Make sure that there are handrails on both sides of the stairs and use them. Fix handrails that are broken or loose. Make sure that handrails are as long as the stairways. Check any carpeting to  make sure that it is firmly attached to the stairs. Fix any carpet that is loose or worn. Avoid having throw rugs at the top or bottom of the stairs. If you do have throw rugs, attach them to the floor with carpet tape. Make sure that you have a light switch at the top of the stairs and the bottom of the stairs. If you do not have them, ask someone to add them for you. What else can I do to help prevent falls? Wear shoes that: Do not have high heels. Have rubber bottoms. Are comfortable and fit you well. Are closed at the toe. Do not wear sandals. If you use a stepladder: Make sure that it is fully opened. Do not climb a closed stepladder. Make sure that both sides of the stepladder are locked into place. Ask someone to hold it for you, if possible. Clearly mark and make sure that you can see: Any grab bars or handrails. First and last steps. Where the edge of each step is. Use tools that help you move around (mobility aids) if they are needed. These include: Canes. Walkers. Scooters. Crutches. Turn on the lights when you go into a dark area. Replace any light bulbs as soon as they burn out. Set up your furniture so you have a clear path. Avoid moving your furniture around. If any of your floors are uneven, fix them. If there are any pets around you, be aware of where they are. Review your medicines with your doctor. Some medicines can make you feel dizzy. This can increase your chance of falling. Ask your doctor what other things that you can do to help prevent falls. This information is not intended to replace advice given to you by your health care provider. Make sure you discuss any questions you have with your health care provider. Document Released: 05/20/2009 Document Revised: 12/30/2015 Document Reviewed: 08/28/2014 Elsevier Interactive Patient Education  2017 [employer].

## 2023-03-14 ENCOUNTER — Telehealth: Payer: Self-pay | Admitting: Pharmacist

## 2023-03-14 DIAGNOSIS — N1831 Chronic kidney disease, stage 3a: Secondary | ICD-10-CM

## 2023-03-14 NOTE — Telephone Encounter (Signed)
    03/14/2023 Name: Kelsey Porter MRN: [id number] DOB: February 22, 1956  Message received from PCP Potential new start Chauncey Mann for CKD Per package insert: measure serum potassium 4 weeks after initiating treatment and maintain 20 mg daily for serum potassium up to 5.5 mEq/L; adjust dose as needed based on serum potassium obtained 4 weeks after a dose adjustment, and periodically throughout treatment Will likely seek PAP via [employer] patient assistance Appreciate CPhT assistance! [id number] placed      Latest Ref Rng & Units 03/09/2023    8:15 AM 03/08/2022   10:25 AM 09/29/2021   12:23 PM  BMP  Glucose 70 - 99 mg/dL 621  308  83   BUN 8 - 27 mg/dL 32  24  20   Creatinine 0.57 - 1.00 mg/dL 6.57  8.46  9.62   BUN/Creat Ratio 12 - 28 28  22  17    Sodium 134 - 144 mmol/L 135  136  138   Potassium 3.5 - 5.2 mmol/L 4.6  4.9  4.7   Chloride 96 - 106 mmol/L 99  100  102   CO2 20 - 29 mmol/L 21  21  24    Calcium 8.7 - 10.3 mg/dL 95.2  9.7  9.8     Kieth Brightly, PharmD, BCACP Clinical Pharmacist, [facility]

## 2023-03-16 ENCOUNTER — Telehealth: Payer: Self-pay

## 2023-03-16 NOTE — Progress Notes (Signed)
   Care Guide Note  03/16/2023 Name: Kelsey Porter MRN: [id number] DOB: 01/28/1956  Referred by: Gabriel Earing, FNP Reason for referral : Care Management (Outreach to schedule with pharm d )   Kelsey Porter is a 67 y.o. year old female who is a primary care patient of Gabriel Earing, FNP. [name] was referred to the pharmacist for assistance related to CKD Stage 3 .    An unsuccessful telephone outreach was attempted today to contact the patient who was referred to the pharmacy team for assistance with medication assistance. Additional attempts will be made to contact the patient.   Penne Lash, RMA Care Guide [facility]  [place], [place] [address] Direct Dial: [phone] [email or web address]

## 2023-03-21 NOTE — Progress Notes (Signed)
   Care Guide Note  03/21/2023 Name: Kelsey Porter MRN: [id number] DOB: 01-13-1956  Referred by: Gabriel Earing, FNP Reason for referral : Care Management (Outreach to schedule with pharm d )   Kelsey Porter is a 67 y.o. year old female who is a primary care patient of Gabriel Earing, FNP. Kelsey Porter was referred to the pharmacist for assistance related to CKD Stage 3 .    Successful contact was made with the patient to discuss pharmacy services including being ready for the pharmacist to call at least 5 minutes before the scheduled appointment time, to have medication bottles and any blood sugar or blood pressure readings ready for review. The patient agreed to meet with the pharmacist via with the pharmacist via telephone visit on (date/time).   03/29/2023  Kelsey Porter, RMA Care Guide [facility]  [place], [place] [address] Direct Dial: [phone] [email or web address]

## 2023-03-27 ENCOUNTER — Telehealth: Payer: Self-pay | Admitting: *Deleted

## 2023-03-27 NOTE — Telephone Encounter (Signed)
I spoke to pt and advised her that we received her ozempic shipment and pt will get it when she is in office with Raynelle Fanning on Thursday.

## 2023-03-29 ENCOUNTER — Ambulatory Visit (INDEPENDENT_AMBULATORY_CARE_PROVIDER_SITE_OTHER): Payer: Medicare HMO | Admitting: Pharmacist

## 2023-03-29 ENCOUNTER — Ambulatory Visit: Payer: Medicare HMO

## 2023-03-29 ENCOUNTER — Telehealth: Payer: Self-pay | Admitting: Pharmacist

## 2023-03-29 VITALS — Wt 141.0 lb

## 2023-03-29 DIAGNOSIS — N1831 Chronic kidney disease, stage 3a: Secondary | ICD-10-CM | POA: Diagnosis not present

## 2023-03-29 DIAGNOSIS — E1165 Type 2 diabetes mellitus with hyperglycemia: Secondary | ICD-10-CM

## 2023-03-29 MED ORDER — DAPAGLIFLOZIN PROPANEDIOL 10 MG PO TABS: 10.0000 mg | ORAL_TABLET | Freq: Every day | ORAL | 3 refills | Status: DC

## 2023-03-29 NOTE — Telephone Encounter (Signed)
Can you email me the Chauncey Mann Health and safety inspector) patient assistance program for this patient? I have her financials in office

## 2023-03-29 NOTE — Progress Notes (Signed)
   03/29/2023 Name: Shikera Hodapp MRN: [id number] DOB: Apr 14, 1956  Chief Complaint  Patient presents with   Chronic Kidney Disease    Kitanna Urness is a 67 y.o. year old female who was referred for medication management by their primary care provider, Gabriel Earing, FNP. They presented for a face to face visit today.   They were referred to the pharmacist by their PCP for assistance in managing diabetes, CKD and medication access.  Her PCP discussed starting Chauncey Mann for her CKD.  Pharmacy to assist in medication therapy management.     Subjective:  Care Team: Primary Care Provider: Gabriel Earing, FNP   Medication Access/Adherence  Patient reports affordability concerns with their medications: Yes  Patient reports access/transportation concerns to their pharmacy: No  Patient reports adherence concerns with their medications:  No   Diabetes/CKD:  Current medications: Ozempic, Farxiga Medications tried in the past: metformin, glipizide  Current glucose readings: FBG<130  Patient denies hypoglycemic s/sx including dizziness, shakiness, sweating. Patient denies hyperglycemic symptoms including polyuria, polydipsia, polyphagia, nocturia, neuropathy, blurred vision.  Current meal patterns:  Discussed meal planning options and Plate method for healthy eating Avoid sugary drinks and desserts Incorporate balanced protein, non starchy veggies, 1 serving of carbohydrate with each meal Increase water intake Increase physical activity as able  Current physical activity: encouraged as able  Current medication access support:  Ozempic via [employer] patient assistance program; meds ship to PCP office; please send message to [employer], CPhT or Vanice Sarah, PharmD if refills/adjustments are required; ALL shipments (new, refill, dose changes can take up to 4-6 weeks); patient can call [phone] for details on their program/shipments  -FARXIGA via [employer] PAP;  med ships to pt home; ESCRIBE TO [facility] IN EPIC FOR REFILLS    Objective:  Lab Results  Component Value Date   HGBA1C 7.0 (H) 03/09/2023    Lab Results  Component Value Date   CREATININE 1.14 (H) 03/09/2023   BUN 32 (H) 03/09/2023   NA 135 03/09/2023   K 4.6 03/09/2023   CL 99 03/09/2023   CO2 21 03/09/2023    Lab Results  Component Value Date   CHOL 194 03/09/2023   HDL 93 03/09/2023   LDLCALC 87 03/09/2023   TRIG 78 03/09/2023   CHOLHDL 2.1 03/09/2023    Medications Reviewed Today   Medications were not reviewed in this encounter      Assessment/Plan:   Chronic Kidney Disease/Diabetes: - Currently controlled--patient's A1c was 7.0%, however her GFR remains at [facility] & PCP would like to initiate [place] - Reviewed long term cardiovascular and renal outcomes of uncontrolled blood sugar - Reviewed goal A1c, goal fasting, and goal 2 hour post prandial glucose - Reviewed dietary modifications including FOLLOWING A HEART HEALTHY DIET/HEALTHY PLATE METHOD  Patient is very diet conscious and follows a diabetic/kidney diet - Recommend to :  Continue Ozempic & Farxiga Start Kerendia 10mg  daily--will need to seek patient assistance through [place] as medication will be expensive  Baseline potassium within normal limits, will then draw potassium  - Recommend to check glucose daily (fasting) or if symptomatic   Follow Up Plan: 3 weeks    Kieth Brightly, PharmD, BCACP Clinical Pharmacist, [facility]

## 2023-04-02 ENCOUNTER — Other Ambulatory Visit (HOSPITAL_COMMUNITY): Payer: Self-pay

## 2023-04-24 ENCOUNTER — Ambulatory Visit (INDEPENDENT_AMBULATORY_CARE_PROVIDER_SITE_OTHER): Payer: Medicare HMO | Admitting: Pharmacist

## 2023-04-24 DIAGNOSIS — E1165 Type 2 diabetes mellitus with hyperglycemia: Secondary | ICD-10-CM

## 2023-04-24 NOTE — Progress Notes (Unsigned)
04/24/2023 Name: Kelsey Porter MRN: [id number] DOB: 04-04-1956  Chief Complaint  Patient presents with   Diabetes    Kelsey Porter is a 67 y.o. year old female who presented for a telephone visit.   They were referred to the pharmacist by their PCP for assistance in managing diabetes, CKD.  Subjective: Patient is doing well - has made additional lifestyle changes since her last A1c was 7.0%.   Care Team: Primary Care Provider: Gabriel Earing, FNP ; Next Scheduled Visit: Feb 2025.   Medication Access/Adherence  Current Pharmacy:  [facility] [address], [place] - [address] [place] [address] Phone: [phone] Fax: [phone]  [id number] - [place], [place] - [address] [place] [address] Phone: [phone] Fax: [phone]  [facility] (Now [facility]) - [address] [place], [place] - [address] [place] [address] Phone: [phone] Fax: [phone]  [facility] - [place], [place] - [address] [place] [address] Phone: [phone] Fax: [phone]  [facility] - [place], [place] - [address] [place] [address] Phone: [phone] Fax: [phone]   Patient reports affordability concerns with their medications: Yes - in process of getting patient assistance for Kerendia Patient reports access/transportation concerns to their pharmacy: No  Patient reports adherence concerns with their medications:  No    Diabetes:  Current medications: Ozempic (semaglutide) 2 mg on Tuesdays, Farxiga (dapagliflozin) 10 mg daily.   Pt denies GI AE with Ozempic - does have occasional nausea but it is not bothersome.   Medications tried in the past:   Current glucose readings: 137 PPG Checking BG with meter occasionally. Last checked last week.   Patient  denies hypoglycemic s/sx including dizziness, shakiness, sweating. Patient denies hyperglycemic symptoms including polyuria, polydipsia, polyphagia, nocturia, neuropathy, blurred vision.  Current meal patterns: Has trying to avoid carbohydrates.   ASCVD 10 yr risk 11.7%  Objective:  Lab Results  Component Value Date   HGBA1C 7.0 (H) 03/09/2023    Lab Results  Component Value Date   CREATININE 1.14 (H) 03/09/2023   BUN 32 (H) 03/09/2023   NA 135 03/09/2023   K 4.6 03/09/2023   CL 99 03/09/2023   CO2 21 03/09/2023    Lab Results  Component Value Date   CHOL 194 03/09/2023   HDL 93 03/09/2023   LDLCALC 87 03/09/2023   TRIG 78 03/09/2023   CHOLHDL 2.1 03/09/2023    Medications Reviewed Today     Reviewed by Kelsey Porter, RPH (Pharmacist) on 04/24/23 at 1452  Med List Status: <None>   Medication Order Taking? Sig Documenting Provider Last Dose Status Informant  amLODipine (NORVASC) 5 MG tablet [id number] Yes Take 1 tablet (5 mg total) by mouth daily. Kelsey Earing, FNP Taking Active   atorvastatin (LIPITOR) 20 MG tablet [id number] Yes Take 1 tablet (20 mg total) by mouth daily. Kelsey Earing, FNP Taking Active   CALCIUM PO [id number]  Take by mouth. [provider]  Active   dapagliflozin propanediol (FARXIGA) 10 MG TABS tablet [id number] Yes Take 1 tablet (10 mg total) by mouth daily. Kelsey Earing, FNP Taking Active   EPINEPHrine 0.3 mg/0.3 mL IJ SOAJ injection [id number]  Inject 0.3 mg into the muscle as needed for anaphylaxis. Kelsey Earing, FNP  Active   FLUoxetine [facility])  40 MG capsule [id number] Yes Take 1 capsule (40 mg total) by mouth daily. Kelsey Earing, FNP Taking Active   fluticasone [place]) 50 MCG/ACT nasal spray [id number]  Place 2 sprays into both nostrils daily. Kelsey Earing, FNP  Active   lisinopril (ZESTRIL) 10 MG tablet [id number] Yes Take 1 tablet (10 mg total) by mouth daily. Kelsey Earing, FNP Taking Active   Semaglutide,  2 MG/DOSE, (OZEMPIC, 2 MG/DOSE,) 8 MG/3ML SOPN [id number] Yes Inject 2 mg into the skin once a week. Kelsey Earing, FNP Taking Active            Med Note Kelsey Porter, Kendal Hymen Aug 14, 2021 10:59 PM) VIA [facility]OVO [facility] PATIENT ASSISTANCE PROGRAM              Assessment/Plan:   Diabetes/CKD: - Currently uncontrolled with A1c of 7% above goal < 7% - but expect that BG will be improved with pt reported dietary changes. Pt is tolerating max dose GLP-1 and SGLT2 well. Will f/u with [employer] concerning finerenone Kelsey Porter) pt assistance application to help slow progression of diabetic kidney disease.  - Reviewed long term cardiovascular and renal outcomes of uncontrolled blood sugar - Reviewed goal A1c, goal fasting, and goal 2 hour post prandial glucose - Reviewed dietary modifications including reducing  - Reviewed lifestyle modifications including: - Recommend to continue semaglutide (Ozempic) 2 mg weekly, dapagliflozin (Farxiga) 10 mg daily  - Will f/u with [employer] concerning Kerendia PAP and adjust pt follow-up appt and labs accordingly.  - Recommend to check glucose once weekly fasting or post-prandial glucose - Pt is appropriately treated with moderate intensity statin given intermediate ASCVD 10 yr risk of 11.7%.   Follow Up Plan: 06/05/23 pharmacist telephone - will adjust as needed.   Kelsey Porter, PharmD PGY1 Pharmacy Resident

## 2023-04-26 NOTE — Telephone Encounter (Signed)
Not sure if app was faxed yet.  I rec'd app 04/24/23 & faxed today 04/26/23.  Submitted application for [facility] to [facility] for patient assistance.   Phone: [phone]

## 2023-05-07 ENCOUNTER — Other Ambulatory Visit (HOSPITAL_COMMUNITY): Payer: Self-pay

## 2023-05-07 NOTE — Telephone Encounter (Signed)
Received notification from [employer] [place] regarding patient assistance DENIAL for [facility].  Per company, patient is denied due to low copay. An appeal is available for extenuating circumstances as listed below.    Per test claim: 30 day supply is $45  Patient may want to apply for LIS/Extra Help with [employer].  Company Phone:[phone]

## 2023-05-17 ENCOUNTER — Encounter: Payer: Self-pay | Admitting: Pharmacist

## 2023-05-23 ENCOUNTER — Other Ambulatory Visit: Payer: Self-pay | Admitting: Emergency Medicine

## 2023-05-23 DIAGNOSIS — Z122 Encounter for screening for malignant neoplasm of respiratory organs: Secondary | ICD-10-CM

## 2023-05-23 DIAGNOSIS — F17210 Nicotine dependence, cigarettes, uncomplicated: Secondary | ICD-10-CM

## 2023-05-23 DIAGNOSIS — Z87891 Personal history of nicotine dependence: Secondary | ICD-10-CM

## 2023-05-25 ENCOUNTER — Ambulatory Visit (INDEPENDENT_AMBULATORY_CARE_PROVIDER_SITE_OTHER): Payer: Medicare HMO | Admitting: Adult Health

## 2023-05-25 ENCOUNTER — Encounter: Payer: Self-pay | Admitting: Adult Health

## 2023-05-25 DIAGNOSIS — F17210 Nicotine dependence, cigarettes, uncomplicated: Secondary | ICD-10-CM

## 2023-05-25 NOTE — Patient Instructions (Signed)

## 2023-05-25 NOTE — Progress Notes (Signed)
  Virtual Visit via Telephone Note  I connected with Kelsey Porter , 05/25/23 8:42 AM by a telemedicine application and verified that I am speaking with the correct person using two identifiers.  Location: Patient: home Provider: home   I discussed the limitations of evaluation and management by telemedicine and the availability of in person appointments. The patient expressed understanding and agreed to proceed.   Shared Decision Making Visit Lung Cancer Screening Program [phone])   Eligibility: 67 y.o. Pack Years Smoking History Calculation =48 (# packs/per year x # years smoked) Recent History of coughing up blood  no Unexplained weight loss? no ( >Than 15 pounds within the last 6 months ) Prior History Lung / other cancer no (Diagnosis within the last 5 years already requiring surveillance chest CT Scans). Smoking Status Current Smoker   Visit Components: Discussion included one or more decision making aids. YES Discussion included risk/benefits of screening. YES Discussion included potential follow up diagnostic testing for abnormal scans. YES Discussion included meaning and risk of over diagnosis. YES Discussion included meaning and risk of False Positives. YES Discussion included meaning of total radiation exposure. YES  Counseling Included: Importance of adherence to annual lung cancer LDCT screening. YES Impact of comorbidities on ability to participate in the program. YES Ability and willingness to under diagnostic treatment. YES  Smoking Cessation Counseling: Current Smokers:  Discussed importance of smoking cessation. yes Information about tobacco cessation classes and interventions provided to patient. yes Patient provided with "ticket" for LDCT Scan. yes Symptomatic Patient. no Diagnosis Code: Tobacco Use Z72.0 Asymptomatic Patient yes  Counseling (Intermediate counseling: > three minutes counseling) [id number] Written Order for Lung Cancer Screening with  LDCT placed in Epic. Yes (CT Chest Lung Cancer Screening Low Dose W/O CM) [id number]  Z12.2-Screening of respiratory organs Z87.891-Personal history of nicotine dependence   [name] 05/25/23

## 2023-05-28 ENCOUNTER — Ambulatory Visit (HOSPITAL_COMMUNITY)
Admission: RE | Admit: 2023-05-28 | Discharge: 2023-05-28 | Disposition: A | Discharge status: Home / Self Care | Payer: Medicare HMO | Source: Ambulatory Visit | Attending: Acute Care | Admitting: Acute Care

## 2023-05-28 DIAGNOSIS — F17210 Nicotine dependence, cigarettes, uncomplicated: Secondary | ICD-10-CM | POA: Diagnosis not present

## 2023-05-28 DIAGNOSIS — Z87891 Personal history of nicotine dependence: Secondary | ICD-10-CM | POA: Insufficient documentation

## 2023-05-28 DIAGNOSIS — Z122 Encounter for screening for malignant neoplasm of respiratory organs: Secondary | ICD-10-CM | POA: Insufficient documentation

## 2023-06-05 ENCOUNTER — Telehealth: Payer: Self-pay | Admitting: Pharmacist

## 2023-06-05 ENCOUNTER — Ambulatory Visit (INDEPENDENT_AMBULATORY_CARE_PROVIDER_SITE_OTHER): Payer: Medicare HMO

## 2023-06-05 DIAGNOSIS — Z7985 Long-term (current) use of injectable non-insulin antidiabetic drugs: Secondary | ICD-10-CM

## 2023-06-05 DIAGNOSIS — E1165 Type 2 diabetes mellitus with hyperglycemia: Secondary | ICD-10-CM

## 2023-06-05 DIAGNOSIS — E119 Type 2 diabetes mellitus without complications: Secondary | ICD-10-CM

## 2023-06-05 NOTE — Telephone Encounter (Signed)
Can you make sure patient gets re-enrolled for Ozempic 2mg  and Farxiga 10mg  daily.  She was denied for Chauncey Mann Thank you! Raynelle Fanning

## 2023-06-05 NOTE — Telephone Encounter (Signed)
Hey patient said she received something in the mail re: re-enrollment, however she said there were instructions to apply online? I didn't know if she was referring to your letter or letters from PAPs

## 2023-06-19 ENCOUNTER — Telehealth: Payer: Self-pay | Admitting: Family Medicine

## 2023-06-19 DIAGNOSIS — F339 Major depressive disorder, recurrent, unspecified: Secondary | ICD-10-CM

## 2023-06-19 NOTE — Telephone Encounter (Signed)
Copied from CRM [id number]. Topic: Clinical - Lab/Test Results >> Jun 19, 2023  9:50 AM Almira Coaster wrote: Reason for CRM: Patient has questions regarding the CT chest lung cancer screening results.

## 2023-06-19 NOTE — Telephone Encounter (Signed)
Called patient to inform her that results are not back for the CT. I check with [facility] regarding report.   Patient also wants to know if her Prozac can be increased. She states that her depression has been worse the last few months - she is getting down more often and taking longer to feel better.  Please review and advise

## 2023-06-20 ENCOUNTER — Other Ambulatory Visit (HOSPITAL_COMMUNITY): Payer: Self-pay

## 2023-06-20 ENCOUNTER — Other Ambulatory Visit: Payer: Self-pay | Admitting: Acute Care

## 2023-06-20 DIAGNOSIS — Z87891 Personal history of nicotine dependence: Secondary | ICD-10-CM

## 2023-06-20 DIAGNOSIS — F17210 Nicotine dependence, cigarettes, uncomplicated: Secondary | ICD-10-CM

## 2023-06-20 DIAGNOSIS — Z122 Encounter for screening for malignant neoplasm of respiratory organs: Secondary | ICD-10-CM

## 2023-06-20 MED ORDER — FLUOXETINE HCL 20 MG PO CAPS: 20.0000 mg | ORAL_CAPSULE | Freq: Every day | ORAL | 3 refills | Status: DC

## 2023-06-20 MED ORDER — DAPAGLIFLOZIN PROPANEDIOL 10 MG PO TABS: 10.0000 mg | ORAL_TABLET | Freq: Every day | ORAL | 3 refills | Status: DC

## 2023-06-20 NOTE — Addendum Note (Signed)
Addended by: Gabriel Earing on: 06/20/2023 07:57 AM   Modules accepted: Orders

## 2023-06-20 NOTE — Progress Notes (Signed)
Pharmacy Medication Assistance Program Note    06/20/2023  Patient ID: Kelsey Porter, female  DOB: January 16, 1956, 67 y.o.  MRN:  [id number]     06/20/2023  Outreach Medication Two  Initial Outreach Date (Medication Two) 06/05/2023  Manufacturer Medication Two [employer]  [employer] [facility] Farxiga  Dose of Farxiga 10MG   Type of Sport and exercise psychologist  Name of Prescriber TIFFAN MORGAN  Method Application Sent to [employer] Fax  Date Application Submitted to Manufacturer 06/20/2023     Could RX for Farxiga 10mg  be sent to [facility] pharmacy?  Patient page faxed for renewal 06/20/23.  Siri Cole, CphT Pharmacy Patient Advocate        06/20/2023  Outreach Medication One  Initial Outreach Date (Medication One) 06/05/2023  Manufacturer Medication One [employer] [facility] Ozempic  Dose of Ozempic 2mg   Type of Radiographer, therapeutic Assistance  Date Application Sent to Prescriber 06/20/2023  Name of Prescriber Harlow Mares      Faxed novo pcp portion to provider 06/20/23 Patient portion completed online 06/20/23

## 2023-06-20 NOTE — Telephone Encounter (Signed)
Farxiga escribed to [facility] Thank you!

## 2023-06-20 NOTE — Telephone Encounter (Signed)
CT scan will be addressed by pulmonology when they get the report. They are the ordering provider.   Can increase prozac to 60 mg daily. I will send in a 20 mg pill for her to take daily with her 40 mg pill. Follow up in 6 weeks.

## 2023-06-20 NOTE — Telephone Encounter (Signed)
Patient aware of recommendations and verbalizes understanding.  

## 2023-06-27 ENCOUNTER — Other Ambulatory Visit: Payer: Self-pay | Admitting: Family Medicine

## 2023-06-27 DIAGNOSIS — E1159 Type 2 diabetes mellitus with other circulatory complications: Secondary | ICD-10-CM

## 2023-06-27 DIAGNOSIS — E1165 Type 2 diabetes mellitus with hyperglycemia: Secondary | ICD-10-CM

## 2023-06-27 DIAGNOSIS — E1169 Type 2 diabetes mellitus with other specified complication: Secondary | ICD-10-CM

## 2023-06-29 ENCOUNTER — Other Ambulatory Visit: Payer: Self-pay | Admitting: Family Medicine

## 2023-06-29 ENCOUNTER — Telehealth: Payer: Self-pay | Admitting: Pharmacist

## 2023-06-29 DIAGNOSIS — Z1231 Encounter for screening mammogram for malignant neoplasm of breast: Secondary | ICD-10-CM

## 2023-06-29 NOTE — Telephone Encounter (Signed)
   Patient enrolled in the [employer] patient assistance program for [employer].  Updated re-enrollment form sent/faxed for Ozempic 2mg  as requested for [employer]OVO PAP fax.  Kieth Brightly, PharmD, BCACP, CPP Clinical Pharmacist, [facility]

## 2023-07-12 ENCOUNTER — Telehealth: Payer: Self-pay | Admitting: Pharmacist

## 2023-07-16 ENCOUNTER — Ambulatory Visit
Admission: RE | Admit: 2023-07-16 | Discharge: 2023-07-16 | Disposition: A | Discharge status: Home / Self Care | Payer: Medicare HMO | Source: Ambulatory Visit | Attending: Family Medicine | Admitting: Family Medicine

## 2023-07-16 DIAGNOSIS — Z1231 Encounter for screening mammogram for malignant neoplasm of breast: Secondary | ICD-10-CM

## 2023-07-19 NOTE — Progress Notes (Signed)
Pharmacy Medication Assistance Program Note    07/19/2023  Patient ID: Kelsey Porter, female   DOB: March 22, 1956, 67 y.o.   MRN: [id number]     06/20/2023  Outreach Medication One  Initial Outreach Date (Medication One) 06/05/2023  Manufacturer Medication One [employer] [facility] Ozempic  Dose of Ozempic 2mg   Type of Radiographer, therapeutic Assistance  Date Application Sent to Prescriber 06/20/2023  Name of Prescriber Harlow Mares  Patient Assistance Determination Approved  Approval Start Date 07/17/2023  Approval End Date 08/06/2024     Renewal for 2025.

## 2023-07-21 ENCOUNTER — Other Ambulatory Visit: Payer: Self-pay | Admitting: Family Medicine

## 2023-07-21 DIAGNOSIS — F331 Major depressive disorder, recurrent, moderate: Secondary | ICD-10-CM

## 2023-07-29 ENCOUNTER — Other Ambulatory Visit: Payer: Self-pay | Admitting: Family Medicine

## 2023-07-29 DIAGNOSIS — E1159 Type 2 diabetes mellitus with other circulatory complications: Secondary | ICD-10-CM

## 2023-08-13 ENCOUNTER — Ambulatory Visit: Payer: Self-pay | Admitting: Family Medicine

## 2023-08-13 ENCOUNTER — Encounter: Payer: Self-pay | Admitting: Family Medicine

## 2023-08-13 ENCOUNTER — Ambulatory Visit (INDEPENDENT_AMBULATORY_CARE_PROVIDER_SITE_OTHER): Payer: Medicare HMO | Admitting: Family Medicine

## 2023-08-13 VITALS — BP 135/81 | HR 98 | Temp 97.8°F | Ht 63.0 in | Wt 136.0 lb

## 2023-08-13 DIAGNOSIS — J439 Emphysema, unspecified: Secondary | ICD-10-CM | POA: Diagnosis not present

## 2023-08-13 DIAGNOSIS — R051 Acute cough: Secondary | ICD-10-CM

## 2023-08-13 DIAGNOSIS — R0602 Shortness of breath: Secondary | ICD-10-CM

## 2023-08-13 DIAGNOSIS — I700 Atherosclerosis of aorta: Secondary | ICD-10-CM | POA: Diagnosis not present

## 2023-08-13 DIAGNOSIS — J441 Chronic obstructive pulmonary disease with (acute) exacerbation: Secondary | ICD-10-CM

## 2023-08-13 DIAGNOSIS — Z720 Tobacco use: Secondary | ICD-10-CM | POA: Diagnosis not present

## 2023-08-13 LAB — RSV AG, IMMUNOCHR, WAIVED: RSV Ag, Immunochr, Waived: NEGATIVE

## 2023-08-13 LAB — VERITOR FLU A/B WAIVED
Influenza A: NEGATIVE
Influenza B: NEGATIVE

## 2023-08-13 MED ORDER — DOXYCYCLINE HYCLATE 100 MG PO TABS: 100.0000 mg | ORAL_TABLET | Freq: Two times a day (BID) | ORAL | 0 refills | Status: AC

## 2023-08-13 MED ORDER — ALBUTEROL SULFATE HFA 108 (90 BASE) MCG/ACT IN AERS: 2.0000 | INHALATION_SPRAY | Freq: Four times a day (QID) | RESPIRATORY_TRACT | 0 refills | Status: DC | PRN

## 2023-08-13 MED ORDER — PREDNISONE 20 MG PO TABS: 40.0000 mg | ORAL_TABLET | Freq: Every day | ORAL | 0 refills | Status: AC

## 2023-08-13 NOTE — Progress Notes (Signed)
 Acute Office Visit  Subjective:     Patient ID: Kelsey Porter, female    DOB: 03/03/1956, 68 y.o.   MRN: [id number]  Chief Complaint  Patient presents with   Shortness of Breath    Cough This is a new problem. Episode onset: 5-6 days. The problem has been gradually improving. The cough is Productive of sputum (yellow). Associated symptoms include headaches (frontal, maxillary), nasal congestion, postnasal drip and shortness of breath (with activity). Pertinent negatives include no chest pain, chills, ear congestion, ear pain, fever, hemoptysis, sore throat, sweats or wheezing. She has tried OTC cough suppressant (mucinex) for the symptoms. The treatment provided moderate relief.   She has not smoked in 4 days.   Review of Systems  Constitutional:  Negative for chills and fever.  HENT:  Positive for postnasal drip. Negative for ear pain and sore throat.   Respiratory:  Positive for cough and shortness of breath (with activity). Negative for hemoptysis and wheezing.   Cardiovascular:  Negative for chest pain.  Neurological:  Positive for headaches (frontal, maxillary).        Objective:    BP 135/81   Pulse 98   Temp 97.8 F (36.6 C) (Oral)   Ht 5' 3 (1.6 m)   Wt 136 lb (61.7 kg)   SpO2 96%   BMI 24.09 kg/m    Physical Exam Vitals and nursing note reviewed.  Constitutional:      General: She is not in acute distress.    Appearance: She is not ill-appearing, toxic-appearing or diaphoretic.  HENT:     Head: Normocephalic and atraumatic.     Right Ear: Tympanic membrane, ear canal and external ear normal.     Left Ear: Tympanic membrane, ear canal and external ear normal.     Nose: Congestion present.     Right Sinus: Maxillary sinus tenderness and frontal sinus tenderness present.     Left Sinus: Maxillary sinus tenderness and frontal sinus tenderness present.     Mouth/Throat:     Mouth: Mucous membranes are moist.     Pharynx: Oropharynx is clear.   Cardiovascular:     Rate and Rhythm: Normal rate and regular rhythm.  Pulmonary:     Effort: Pulmonary effort is normal. No tachypnea or respiratory distress.     Breath sounds: Examination of the right-lower field reveals rhonchi. Examination of the left-lower field reveals rhonchi. Rhonchi present. No wheezing or rales.  Musculoskeletal:     Cervical back: Normal range of motion and neck supple.  Skin:    General: Skin is warm and dry.  Neurological:     General: No focal deficit present.     Mental Status: She is alert and oriented to person, place, and time.  Psychiatric:        Mood and Affect: Mood normal.        Behavior: Behavior normal.     No results found for any visits on 08/13/23.      Assessment & Plan:   Kelsey Porter was seen today for shortness of breath.  Diagnoses and all orders for this visit:  SOB (shortness of breath) -     Veritor Flu A/B Waived -     RSV Ag, Immunochr, Waived  Acute cough -     Veritor Flu A/B Waived -     RSV Ag, Immunochr, Waived  COPD with exacerbation (HCC) -     doxycycline  (VIBRA -TABS) 100 MG tablet; Take 1 tablet (100 mg total) by mouth  2 (two) times daily for 7 days. -     albuterol  (VENTOLIN  HFA) 108 (90 Base) MCG/ACT inhaler; Inhale 2 puffs into the lungs every 6 (six) hours as needed for wheezing or shortness of breath. -     predniSONE  (DELTASONE ) 20 MG tablet; Take 2 tablets (40 mg total) by mouth daily for 5 days.   Negative rapid flu and RSV today. Outside of window for antiviral treatment for Covid. Reviewed CT lung cancer screening results with patient today which did have findings of COPD. No xray capability today in office. Will treat for COPD exacerbation with doxycyline, prednisone  burst, and albuterol  prn. Return to office for new or worsening symptoms, or if symptoms persist.   The patient indicates understanding of these issues and agrees with the plan.  Kelsey CHRISTELLA Search, FNP

## 2023-08-13 NOTE — Telephone Encounter (Signed)
  Chief Complaint: cough Symptoms: productive cough, congestion, sinus pressure, SOB with activity Frequency: 1 week Pertinent Negatives: Patient denies CP, fever, nausea, vomiting, diarrhea, blood in sputum Disposition: [] ED /[] Urgent Care (no appt availability in office) / [x] Appointment(In office/virtual)/ []  [facility]/ [] Home Care/ [] Refused Recommended Disposition /[] [facility]/ []  Follow-up with PCP Additional Notes: Patient calls stating she has had a productive cough with yellow phlegm, congestion, sinus pressure and pain, SOB with activity x 1 week. Denies CP, SOB at rest. States she has been taking mucinex at home which has helped, has stopped smoking. Per protocol, pt to be evaluated within 4 hours. Scheduled with PCP for 1500 today. Care advice reviewed, verbalized understanding. Alerting PCP for review.   Copied from CRM [id number]. Topic: General - Other >> Aug 13, 2023  1:10 PM Kelsey Porter wrote: Reason for CRM: patient really congested and bad cough, chest pain, lots of mucus and flem Reason for Disposition  [1] MILD difficulty breathing (e.g., minimal/no SOB at rest, SOB with walking, pulse <100) AND [2] still present when not coughing  Answer Assessment - Initial Assessment Questions 1. ONSET: When did the cough begin?      1 week 2. SEVERITY: How bad is the cough today?      Better than yesterday, but still not good. 3. SPUTUM: Describe the color of your sputum (none, dry cough; clear, white, yellow, green)     yellow 4. HEMOPTYSIS: Are you coughing up any blood? If so ask: How much? (flecks, streaks, tablespoons, etc.)     Denies 5. DIFFICULTY BREATHING: Are you having difficulty breathing? If Yes, ask: How bad is it? (e.g., mild, moderate, severe)    - MILD: No SOB at rest, mild SOB with walking, speaks normally in sentences, can lie down, no retractions, pulse < 100.    - MODERATE: SOB at rest, SOB with minimal exertion and prefers to  sit, cannot lie down flat, speaks in phrases, mild retractions, audible wheezing, pulse 100-120.    - SEVERE: Very SOB at rest, speaks in single words, struggling to breathe, sitting hunched forward, retractions, pulse > 120      Mild, with activity- okay at rest 6. FEVER: Do you have a fever? If Yes, ask: What is your temperature, how was it measured, and when did it start?     Denies 7. CARDIAC HISTORY: Do you have any history of heart disease? (e.g., heart attack, congestive heart failure)      Denies 8. LUNG HISTORY: Do you have any history of lung disease?  (e.g., pulmonary embolus, asthma, emphysema)     Denies 9. PE RISK FACTORS: Do you have a history of blood clots? (or: recent major surgery, recent prolonged travel, bedridden)     Denies 10. OTHER SYMPTOMS: Do you have any other symptoms? (e.g., runny nose, wheezing, chest pain)       Congestion in chest, sinus pressure/pain/drainage,  Protocols used: Cough - Acute Productive-A-AH

## 2023-08-16 NOTE — Telephone Encounter (Signed)
 Can we make sure patient is re-enrolled for farxiga & ozempic? I didn't receive any confirmation of see in the chart. Thank you!

## 2023-08-23 ENCOUNTER — Telehealth: Payer: Self-pay | Admitting: Family Medicine

## 2023-08-23 MED ORDER — NYSTATIN 100000 UNIT/ML MT SUSP: OROMUCOSAL | 0 refills | Status: DC

## 2023-08-23 NOTE — Telephone Encounter (Signed)
Copied from CRM [id number]. Topic: Clinical - Medical Advice >> Aug 23, 2023 10:53 AM Carlatta H wrote: Reason for CRM: Patient would like a prescription called in for thrush///Please call patient she doesn't want to schedule an appointment

## 2023-08-23 NOTE — Telephone Encounter (Signed)
I spoke to pt and she has been on prednisone, antibiotic and inhaler and now has thick white on tongue and burning sensation. Pt has had thrush in the past and states it feels the same. Per Tiffany ok to send in Nystatin mouth wash so rx sent in and pt is aware.

## 2023-08-24 NOTE — Telephone Encounter (Signed)
Received notification from [employer] regarding approval for [facility]. Patient assistance approved until 08/06/24.  Medication will ship to patients home  Pt ID: [id number]  Company phone: [phone]

## 2023-08-27 DIAGNOSIS — H524 Presbyopia: Secondary | ICD-10-CM | POA: Diagnosis not present

## 2023-09-08 ENCOUNTER — Other Ambulatory Visit: Payer: Self-pay | Admitting: Family Medicine

## 2023-09-08 DIAGNOSIS — E1165 Type 2 diabetes mellitus with hyperglycemia: Secondary | ICD-10-CM

## 2023-09-08 DIAGNOSIS — E1159 Type 2 diabetes mellitus with other circulatory complications: Secondary | ICD-10-CM

## 2023-09-08 DIAGNOSIS — E1169 Type 2 diabetes mellitus with other specified complication: Secondary | ICD-10-CM

## 2023-09-10 ENCOUNTER — Encounter: Payer: Self-pay | Admitting: Family Medicine

## 2023-09-10 ENCOUNTER — Telehealth: Payer: Self-pay | Admitting: *Deleted

## 2023-09-10 ENCOUNTER — Ambulatory Visit (INDEPENDENT_AMBULATORY_CARE_PROVIDER_SITE_OTHER): Payer: Medicare HMO | Admitting: Family Medicine

## 2023-09-10 VITALS — BP 117/76 | HR 96 | Temp 97.9°F | Ht 63.0 in | Wt 132.0 lb

## 2023-09-10 DIAGNOSIS — E785 Hyperlipidemia, unspecified: Secondary | ICD-10-CM

## 2023-09-10 DIAGNOSIS — E1159 Type 2 diabetes mellitus with other circulatory complications: Secondary | ICD-10-CM

## 2023-09-10 DIAGNOSIS — F339 Major depressive disorder, recurrent, unspecified: Secondary | ICD-10-CM | POA: Diagnosis not present

## 2023-09-10 DIAGNOSIS — J439 Emphysema, unspecified: Secondary | ICD-10-CM | POA: Diagnosis not present

## 2023-09-10 DIAGNOSIS — N1831 Chronic kidney disease, stage 3a: Secondary | ICD-10-CM | POA: Diagnosis not present

## 2023-09-10 DIAGNOSIS — F17201 Nicotine dependence, unspecified, in remission: Secondary | ICD-10-CM | POA: Insufficient documentation

## 2023-09-10 DIAGNOSIS — I152 Hypertension secondary to endocrine disorders: Secondary | ICD-10-CM | POA: Diagnosis not present

## 2023-09-10 DIAGNOSIS — F419 Anxiety disorder, unspecified: Secondary | ICD-10-CM | POA: Diagnosis not present

## 2023-09-10 DIAGNOSIS — E1169 Type 2 diabetes mellitus with other specified complication: Secondary | ICD-10-CM | POA: Diagnosis not present

## 2023-09-10 DIAGNOSIS — Z7984 Long term (current) use of oral hypoglycemic drugs: Secondary | ICD-10-CM

## 2023-09-10 DIAGNOSIS — E1165 Type 2 diabetes mellitus with hyperglycemia: Secondary | ICD-10-CM | POA: Diagnosis not present

## 2023-09-10 DIAGNOSIS — Z7985 Long-term (current) use of injectable non-insulin antidiabetic drugs: Secondary | ICD-10-CM

## 2023-09-10 LAB — BAYER DCA HB A1C WAIVED: HB A1C (BAYER DCA - WAIVED): 7.5 % — ABNORMAL HIGH (ref 4.8–5.6)

## 2023-09-10 MED ORDER — LISINOPRIL 10 MG PO TABS: 10.0000 mg | ORAL_TABLET | Freq: Every day | ORAL | 0 refills | Status: DC

## 2023-09-10 MED ORDER — AMLODIPINE BESYLATE 5 MG PO TABS: 5.0000 mg | ORAL_TABLET | Freq: Every day | ORAL | 3 refills | Status: DC

## 2023-09-10 MED ORDER — FLUOXETINE HCL 40 MG PO CAPS: 80.0000 mg | ORAL_CAPSULE | Freq: Every day | ORAL | 3 refills | Status: DC

## 2023-09-10 MED ORDER — ATORVASTATIN CALCIUM 20 MG PO TABS: 20.0000 mg | ORAL_TABLET | Freq: Every day | ORAL | 0 refills | Status: DC

## 2023-09-10 MED ORDER — GLIPIZIDE 5 MG PO TABS: 5.0000 mg | ORAL_TABLET | Freq: Two times a day (BID) | ORAL | 3 refills | Status: DC

## 2023-09-10 NOTE — Progress Notes (Signed)
Established Patient Office Visit  Subjective   Patient ID: Kelsey Porter, female    DOB: Jul 01, 1956  Age: 68 y.o. MRN: [id number]  Chief Complaint  Patient presents with   Medical Management of Chronic Issues   care gaps    A1c// obtained Foot// agrees    HPI T2DM Pt presents for follow up evaluation of Type 2 diabetes mellitus.   Patient denies foot ulcerations, increased appetite, nausea, paresthesia of the feet, polydipsia, polyuria, visual disturbances, vomiting, and weight loss.  Current diabetic medications include: farxiga 10 mg, ozempic 2 mg Compliant with meds - Yes  She is awaiting shipping of ozempic for patient assistance. On her last injection this week. She has been approved for assistance.   Weight trend: stable Current diet: has had difficulty affording healthier foods with higher grocery prices. She has been doing her best to maintain a healthy diet Current exercise: active with grandchildren  2. HTN Complaint with meds - Yes Current Medications - lisinopril, amlodipine Checking BP at home - no Pertinent ROS:  Headache - No Fatigue - No Visual Disturbances - No Chest pain - No Dyspnea - No Palpitations - No LE edema - No  3. CKD On farxiga and lisinopril.   4. Depression/anxiety Complaint with prozac 60mg  daily. Increased symptoms for the last few months. Feeling overwhelmed, sad, down, irritable. Trigger by recent political changes. No SI. No sleeping well. She has been chopping wood and using sleepy time tea to help manage her symptoms.   5. Tobacco use Quit smoking 1 month ago.       Past Medical History:  Diagnosis Date   Allergy    Arthritis    COPD (chronic obstructive pulmonary disease) (HCC)    Depression    Diabetes mellitus without complication (HCC)    History of Bell's palsy    Hyperlipidemia    Hypertension       ROS As per HPI.    Objective:     BP 117/76   Pulse 96   Temp 97.9 F (36.6 C)   Ht 5\' 3"   (1.6 m)   Wt 132 lb (59.9 kg)   SpO2 97%   BMI 23.38 kg/m  Wt Readings from Last 3 Encounters:  09/10/23 132 lb (59.9 kg)  08/13/23 136 lb (61.7 kg)  03/29/23 141 lb (64 kg)      Physical Exam Vitals and nursing note reviewed.  Constitutional:      General: She is not in acute distress.    Appearance: Normal appearance. She is not ill-appearing.  Neck:     Vascular: No carotid bruit.  Cardiovascular:     Rate and Rhythm: Normal rate and regular rhythm.     Pulses: Normal pulses.     Heart sounds: Normal heart sounds. No murmur heard. Pulmonary:     Effort: Pulmonary effort is normal. No respiratory distress.     Breath sounds: Normal breath sounds.  Musculoskeletal:     Cervical back: Neck supple. No tenderness.     Right lower leg: No edema.     Left lower leg: No edema.  Lymphadenopathy:     Cervical: No cervical adenopathy.  Skin:    General: Skin is warm and dry.  Neurological:     General: No focal deficit present.     Mental Status: She is alert and oriented to person, place, and time.  Psychiatric:        Mood and Affect: Mood normal.  Behavior: Behavior normal.        Thought Content: Thought content normal.        Judgment: Judgment normal.    Diabetic Foot Exam - Simple   Simple Foot Form Diabetic Foot exam was performed with the following findings: Yes 09/10/2023  9:15 AM  Visual Inspection No deformities, no ulcerations, no other skin breakdown bilaterally: Yes Sensation Testing Intact to touch and monofilament testing bilaterally: Yes Pulse Check Posterior Tibialis and Dorsalis pulse intact bilaterally: Yes Comments     No results found for any visits on 09/10/23.  Last CBC Lab Results  Component Value Date   WBC 8.7 03/09/2023   HGB 14.5 03/09/2023   HCT 44.2 03/09/2023   MCV 92 03/09/2023   MCH 30.3 03/09/2023   RDW 11.9 03/09/2023   PLT 299 03/09/2023   Last metabolic panel Lab Results  Component Value Date   GLUCOSE 124 (H)  03/09/2023   NA 135 03/09/2023   K 4.6 03/09/2023   CL 99 03/09/2023   CO2 21 03/09/2023   BUN 32 (H) 03/09/2023   CREATININE 1.14 (H) 03/09/2023   EGFR 53 (L) 03/09/2023   CALCIUM 10.0 03/09/2023   PROT 6.9 03/09/2023   ALBUMIN 4.2 03/09/2023   LABGLOB 2.7 03/09/2023   AGRATIO 1.6 03/08/2022   BILITOT 0.3 03/09/2023   ALKPHOS 83 03/09/2023   AST 20 03/09/2023   ALT 17 03/09/2023   ANIONGAP 9 05/19/2021   Last lipids Lab Results  Component Value Date   CHOL 194 03/09/2023   HDL 93 03/09/2023   LDLCALC 87 03/09/2023   TRIG 78 03/09/2023   CHOLHDL 2.1 03/09/2023   Last thyroid functions Lab Results  Component Value Date   TSH 2.250 08/04/2020      The 10-year ASCVD risk score (Arnett DK, et al., 2019) is: 12.1%    Assessment & Plan:   Nelta was seen today for medical management of chronic issues and care gaps.  Diagnoses and all orders for this visit:  Type 2 diabetes mellitus with hyperglycemia, without long-term current use of insulin (HCC) A1c 7.5 today, not at goal. Continue ozempic 2 mg, farxiga 10 mg. Will add glipizide as below due to need to limit costs. On ACE and statin.  -     Bayer DCA Hb A1c Waived -     atorvastatin (LIPITOR) 20 MG tablet; Take 1 tablet (20 mg total) by mouth daily. -     lisinopril (ZESTRIL) 10 MG tablet; Take 1 tablet (10 mg total) by mouth daily. -     glipiZIDE (GLUCOTROL) 5 MG tablet; Take 1 tablet (5 mg total) by mouth 2 (two) times daily before a meal.  Hypertension associated with diabetes (HCC) Well controlled on current regimen.  -     amLODipine (NORVASC) 5 MG tablet; Take 1 tablet (5 mg total) by mouth daily. -     lisinopril (ZESTRIL) 10 MG tablet; Take 1 tablet (10 mg total) by mouth daily.  Hyperlipidemia associated with type 2 diabetes mellitus (HCC) Continue statin. Diet, exercise.  -     atorvastatin (LIPITOR) 20 MG tablet; Take 1 tablet (20 mg total) by mouth daily.  Stage 3a chronic kidney disease (HCC) On  ACE and farxiga. Avoid NSAIDS.   Recurrent depression (HCC) Anxiety Not well controlled. Increase prozac to 80 mg daily.  -     FLUoxetine (PROZAC) 40 MG capsule; Take 2 capsules (80 mg total) by mouth daily.  Pulmonary emphysema, unspecified emphysema type (HCC)  Asymptomatic.   Tobacco use disorder, moderate, in early remission Quit about 1 month ago. Praised for this.   Return in about 6 weeks (around 10/22/2023) for medication follow up.   The patient indicates understanding of these issues and agrees with the plan.  Gabriel Earing, FNP

## 2023-09-10 NOTE — Telephone Encounter (Signed)
Patient is aware that her medication assistance meds are available for pick up

## 2023-09-10 NOTE — Addendum Note (Signed)
Addended by: Gabriel Earing on: 09/10/2023 11:58 AM   Modules accepted: Level of Service

## 2023-09-21 ENCOUNTER — Other Ambulatory Visit: Payer: Self-pay | Admitting: Family Medicine

## 2023-09-21 DIAGNOSIS — J302 Other seasonal allergic rhinitis: Secondary | ICD-10-CM

## 2023-10-17 DIAGNOSIS — E119 Type 2 diabetes mellitus without complications: Secondary | ICD-10-CM | POA: Diagnosis not present

## 2023-10-17 DIAGNOSIS — H25813 Combined forms of age-related cataract, bilateral: Secondary | ICD-10-CM | POA: Diagnosis not present

## 2023-10-17 LAB — HM DIABETES EYE EXAM

## 2023-10-23 DIAGNOSIS — Z79899 Other long term (current) drug therapy: Secondary | ICD-10-CM | POA: Diagnosis not present

## 2023-10-23 DIAGNOSIS — Z7984 Long term (current) use of oral hypoglycemic drugs: Secondary | ICD-10-CM | POA: Diagnosis not present

## 2023-10-23 DIAGNOSIS — H25811 Combined forms of age-related cataract, right eye: Secondary | ICD-10-CM | POA: Diagnosis not present

## 2023-10-23 DIAGNOSIS — I10 Essential (primary) hypertension: Secondary | ICD-10-CM | POA: Diagnosis not present

## 2023-10-23 DIAGNOSIS — J449 Chronic obstructive pulmonary disease, unspecified: Secondary | ICD-10-CM | POA: Diagnosis not present

## 2023-10-23 DIAGNOSIS — E1136 Type 2 diabetes mellitus with diabetic cataract: Secondary | ICD-10-CM | POA: Diagnosis not present

## 2023-10-23 DIAGNOSIS — H52223 Regular astigmatism, bilateral: Secondary | ICD-10-CM | POA: Diagnosis not present

## 2023-10-24 ENCOUNTER — Ambulatory Visit: Payer: Medicare HMO | Admitting: Family Medicine

## 2023-10-30 DIAGNOSIS — E119 Type 2 diabetes mellitus without complications: Secondary | ICD-10-CM | POA: Diagnosis not present

## 2023-10-30 DIAGNOSIS — H25812 Combined forms of age-related cataract, left eye: Secondary | ICD-10-CM | POA: Diagnosis not present

## 2023-10-30 DIAGNOSIS — H2181 Floppy iris syndrome: Secondary | ICD-10-CM | POA: Diagnosis not present

## 2023-10-30 DIAGNOSIS — F419 Anxiety disorder, unspecified: Secondary | ICD-10-CM | POA: Diagnosis not present

## 2023-10-30 DIAGNOSIS — E7800 Pure hypercholesterolemia, unspecified: Secondary | ICD-10-CM | POA: Diagnosis not present

## 2023-10-30 DIAGNOSIS — H5703 Miosis: Secondary | ICD-10-CM | POA: Diagnosis not present

## 2023-10-30 DIAGNOSIS — E1136 Type 2 diabetes mellitus with diabetic cataract: Secondary | ICD-10-CM | POA: Diagnosis not present

## 2023-10-30 DIAGNOSIS — I10 Essential (primary) hypertension: Secondary | ICD-10-CM | POA: Diagnosis not present

## 2023-11-07 ENCOUNTER — Ambulatory Visit (INDEPENDENT_AMBULATORY_CARE_PROVIDER_SITE_OTHER): Admitting: Family Medicine

## 2023-11-07 ENCOUNTER — Encounter: Payer: Self-pay | Admitting: Family Medicine

## 2023-11-07 VITALS — BP 125/75 | HR 105 | Temp 98.1°F | Ht 63.0 in | Wt 129.8 lb

## 2023-11-07 DIAGNOSIS — F419 Anxiety disorder, unspecified: Secondary | ICD-10-CM

## 2023-11-07 DIAGNOSIS — F17201 Nicotine dependence, unspecified, in remission: Secondary | ICD-10-CM

## 2023-11-07 DIAGNOSIS — F339 Major depressive disorder, recurrent, unspecified: Secondary | ICD-10-CM | POA: Diagnosis not present

## 2023-11-07 NOTE — Progress Notes (Signed)
   Established Patient Office Visit  Subjective   Patient ID: Kelsey Porter, female    DOB: 05/03/1956  Age: 68 y.o. MRN: [id number]  Chief Complaint  Patient presents with   Depression    Depression        Kelsey Porter is here for follow up of anxiety and depression. Prozac was increased to 80 mg 6 weeks ago. She reports feeling significantly better. She still feels down at times, but much less often and how has some new coping mechanisms to help. She has now planted a Geophysical data [employer] garden and uses this as a coping mechanisms when feeling down, angry, or stressed. She also continues to be tobacco free.      Review of Systems  Psychiatric/Behavioral:  Positive for depression.    As per HPI.   Objective:     BP 125/75   Pulse (!) 105   Temp 98.1 F (36.7 C) (Temporal)   Ht 5\' 3"  (1.6 m)   Wt 129 lb 12.8 oz (58.9 kg)   SpO2 98%   BMI 22.99 kg/m    Physical Exam Vitals and nursing note reviewed.  Constitutional:      General: She is not in acute distress.    Appearance: Normal appearance. She is not ill-appearing, toxic-appearing or diaphoretic.  Cardiovascular:     Rate and Rhythm: Normal rate and regular rhythm.     Heart sounds: Normal heart sounds. No murmur heard. Pulmonary:     Effort: Pulmonary effort is normal. No respiratory distress.     Breath sounds: Normal breath sounds. No stridor. No wheezing, rhonchi or rales.  Musculoskeletal:     Right lower leg: No edema.     Left lower leg: No edema.  Skin:    General: Skin is warm and dry.  Neurological:     General: No focal deficit present.     Mental Status: She is alert and oriented to person, place, and time.  Psychiatric:        Mood and Affect: Mood normal.        Behavior: Behavior normal.        Thought Content: Thought content normal.        Judgment: Judgment normal.      No results found for any visits on 11/07/23.    The 10-year ASCVD risk score (Arnett DK, et al., 2019) is: 23%     Assessment & Plan:   Aidah was seen today for depression.  Diagnoses and all orders for this visit:  Recurrent depression (HCC) Anxiety Well controlled on current regimen. Continue prozac, lifestyle management.   Tobacco use disorder, moderate, in early remission Praised for cessation.   Return in about 6 weeks (around 12/19/2023) for chronic follow up.   The patient indicates understanding of these issues and agrees with the plan.  Gabriel Earing, FNP

## 2023-11-14 DIAGNOSIS — H25812 Combined forms of age-related cataract, left eye: Secondary | ICD-10-CM | POA: Diagnosis not present

## 2023-11-23 ENCOUNTER — Other Ambulatory Visit: Payer: Self-pay | Admitting: Family Medicine

## 2023-11-23 DIAGNOSIS — E1159 Type 2 diabetes mellitus with other circulatory complications: Secondary | ICD-10-CM

## 2023-11-23 DIAGNOSIS — E1169 Type 2 diabetes mellitus with other specified complication: Secondary | ICD-10-CM

## 2023-11-23 DIAGNOSIS — E1165 Type 2 diabetes mellitus with hyperglycemia: Secondary | ICD-10-CM

## 2023-12-07 ENCOUNTER — Other Ambulatory Visit: Payer: Self-pay | Admitting: Family Medicine

## 2023-12-07 DIAGNOSIS — J441 Chronic obstructive pulmonary disease with (acute) exacerbation: Secondary | ICD-10-CM

## 2023-12-18 ENCOUNTER — Telehealth: Payer: Self-pay

## 2023-12-20 NOTE — Telephone Encounter (Signed)
 fyi

## 2023-12-24 ENCOUNTER — Encounter: Payer: Self-pay | Admitting: Family Medicine

## 2023-12-24 ENCOUNTER — Ambulatory Visit (INDEPENDENT_AMBULATORY_CARE_PROVIDER_SITE_OTHER): Admitting: Family Medicine

## 2023-12-24 VITALS — BP 120/68 | HR 88 | Temp 98.4°F | Ht 63.0 in | Wt 130.8 lb

## 2023-12-24 DIAGNOSIS — J439 Emphysema, unspecified: Secondary | ICD-10-CM

## 2023-12-24 DIAGNOSIS — Z87892 Personal history of anaphylaxis: Secondary | ICD-10-CM

## 2023-12-24 DIAGNOSIS — E1159 Type 2 diabetes mellitus with other circulatory complications: Secondary | ICD-10-CM | POA: Diagnosis not present

## 2023-12-24 DIAGNOSIS — N1831 Chronic kidney disease, stage 3a: Secondary | ICD-10-CM | POA: Diagnosis not present

## 2023-12-24 DIAGNOSIS — E1165 Type 2 diabetes mellitus with hyperglycemia: Secondary | ICD-10-CM

## 2023-12-24 DIAGNOSIS — Z91030 Bee allergy status: Secondary | ICD-10-CM

## 2023-12-24 DIAGNOSIS — R21 Rash and other nonspecific skin eruption: Secondary | ICD-10-CM

## 2023-12-24 DIAGNOSIS — E1169 Type 2 diabetes mellitus with other specified complication: Secondary | ICD-10-CM

## 2023-12-24 DIAGNOSIS — I152 Hypertension secondary to endocrine disorders: Secondary | ICD-10-CM

## 2023-12-24 DIAGNOSIS — I700 Atherosclerosis of aorta: Secondary | ICD-10-CM

## 2023-12-24 DIAGNOSIS — Z23 Encounter for immunization: Secondary | ICD-10-CM

## 2023-12-24 DIAGNOSIS — E785 Hyperlipidemia, unspecified: Secondary | ICD-10-CM

## 2023-12-24 LAB — BAYER DCA HB A1C WAIVED: HB A1C (BAYER DCA - WAIVED): 5.9 % — ABNORMAL HIGH (ref 4.8–5.6)

## 2023-12-24 MED ORDER — ALBUTEROL SULFATE HFA 108 (90 BASE) MCG/ACT IN AERS: 2.0000 | INHALATION_SPRAY | RESPIRATORY_TRACT | 0 refills | Status: DC | PRN

## 2023-12-24 MED ORDER — TRIAMCINOLONE ACETONIDE 0.1 % EX CREA
1.0000 | TOPICAL_CREAM | Freq: Two times a day (BID) | CUTANEOUS | 0 refills | Status: AC
Start: 2023-12-24 — End: ?

## 2023-12-24 MED ORDER — EPINEPHRINE 0.3 MG/0.3ML IJ SOAJ: 0.3000 mg | INTRAMUSCULAR | 2 refills | Status: AC | PRN

## 2023-12-24 NOTE — Progress Notes (Signed)
 Established Patient Office Visit  Subjective   Patient ID: Kelsey Porter, female    DOB: 1956/02/22  Age: 68 y.o. MRN: [id number]  Chief Complaint  Patient presents with   Medical Management of Chronic Issues    HPI  T2DM Pt presents for follow up evaluation of Type 2 diabetes mellitus.   Patient denies foot ulcerations, increased appetite, nausea, paresthesia of the feet, polydipsia, polyuria, visual disturbances, vomiting, and weight loss.  Current diabetic medications include: farxiga  10 mg, ozempic  2 mg, glipidzide 2.5 mg BID Compliant with meds - taking 1/2 dose of glipizide  due to hypoglycemia  Current diet: eating better, has been growing vegetables in her garden for her diet. And will be canning for the winter.  Current exercise: active with grandchildren, gardening  2. HTN Complaint with meds - Yes Current Medications - lisinopril , amlodipine  Checking BP at home - no Pertinent ROS:  Headache - No Fatigue - No Visual Disturbances - No Chest pain - No Dyspnea - No Palpitations - No LE edema - No  3. CKD On farxiga  and lisinopril .   4. Depression/anxiety Complaint with prozac  80 mg daily.   5. COPD Quit smoking 4 months ago. No symptoms.   6. Rash Recurrent rash on upper legs. Using kenalog  prn. Needs refills of kenalog .    Past Medical History:  Diagnosis Date   Allergy    Arthritis    COPD (chronic obstructive pulmonary disease) (HCC)    Depression    Diabetes mellitus without complication (HCC)    History of Bell's palsy    Hyperlipidemia    Hypertension       ROS As per HPI.    Objective:     BP 120/68   Pulse 88   Temp 98.4 F (36.9 C) (Temporal)   Ht 5\' 3"  (1.6 m)   Wt 130 lb 12.8 oz (59.3 kg)   SpO2 93%   BMI 23.17 kg/m  Wt Readings from Last 3 Encounters:  12/24/23 130 lb 12.8 oz (59.3 kg)  11/07/23 129 lb 12.8 oz (58.9 kg)  09/10/23 132 lb (59.9 kg)      Physical Exam Vitals and nursing note reviewed.   Constitutional:      General: She is not in acute distress.    Appearance: Normal appearance. She is not ill-appearing.  Neck:     Vascular: No carotid bruit.  Cardiovascular:     Rate and Rhythm: Normal rate and regular rhythm.     Pulses: Normal pulses.     Heart sounds: Normal heart sounds. No murmur heard. Pulmonary:     Effort: Pulmonary effort is normal. No respiratory distress.     Breath sounds: Normal breath sounds.  Musculoskeletal:     Cervical back: Neck supple. No tenderness.     Right lower leg: No edema.     Left lower leg: No edema.  Lymphadenopathy:     Cervical: No cervical adenopathy.  Skin:    General: Skin is warm and dry.  Neurological:     General: No focal deficit present.     Mental Status: She is alert and oriented to person, place, and time.  Psychiatric:        Mood and Affect: Mood normal.        Behavior: Behavior normal.        Thought Content: Thought content normal.        Judgment: Judgment normal.      Assessment & Plan:   Rasheena was seen  today for medical management of chronic issues.  Diagnoses and all orders for this visit:  Type 2 diabetes mellitus with hyperglycemia, without long-term current use of insulin (HCC) A1c 5.9 today, at goal of <7. D/c glipizide . Continue farxiga , ozempic , diet, exercise. On ACE and statin.  -     Bayer DCA Hb A1c Waived -     CMP14+EGFR  Hypertension associated with diabetes (HCC) Well controlled on current regimen.   Hyperlipidemia associated with type 2 diabetes mellitus (HCC) On statin.   Aortic atherosclerosis (HCC) On statin.   Pulmonary emphysema, unspecified emphysema type (HCC) Denies symptoms. Albuterol  prn.  -     albuterol  (VENTOLIN  HFA) 108 (90 Base) MCG/ACT inhaler; Inhale 2 puffs into the lungs every 4 (four) hours as needed for wheezing or shortness of breath.  Stage 3a chronic kidney disease (HCC) On ACE and farxiga .   History of anaphylaxis Allergy to bee sting Refill  provided.  -     EPINEPHrine  0.3 mg/0.3 mL IJ SOAJ injection; Inject 0.3 mg into the muscle as needed for anaphylaxis.  Rash Well controlled on current regimen.  -     triamcinolone  cream (KENALOG ) 0.1 %; Apply 1 Application topically 2 (two) times daily.  Need for vaccination Shingrix  IM today.     Return in about 3 months (around 03/25/2024) for chronic follow up.   The patient indicates understanding of these issues and agrees with the plan.  Albertha Huger, FNP

## 2023-12-25 LAB — CMP14+EGFR
ALT: 63 IU/L — ABNORMAL HIGH (ref 0–32)
AST: 42 IU/L — ABNORMAL HIGH (ref 0–40)
Albumin: 4.1 g/dL (ref 3.9–4.9)
Alkaline Phosphatase: 109 IU/L (ref 44–121)
BUN/Creatinine Ratio: 21 (ref 12–28)
BUN: 23 mg/dL (ref 8–27)
Bilirubin Total: 0.2 mg/dL (ref 0.0–1.2)
CO2: 19 mmol/L — ABNORMAL LOW (ref 20–29)
Calcium: 9.9 mg/dL (ref 8.7–10.3)
Chloride: 100 mmol/L (ref 96–106)
Creatinine, Ser: 1.1 mg/dL — ABNORMAL HIGH (ref 0.57–1.00)
Globulin, Total: 2.7 g/dL (ref 1.5–4.5)
Glucose: 76 mg/dL (ref 70–99)
Potassium: 4.5 mmol/L (ref 3.5–5.2)
Sodium: 137 mmol/L (ref 134–144)
Total Protein: 6.8 g/dL (ref 6.0–8.5)
eGFR: 55 mL/min/{1.73_m2} — ABNORMAL LOW (ref >59)

## 2023-12-26 ENCOUNTER — Ambulatory Visit: Payer: Self-pay | Admitting: Family Medicine

## 2023-12-26 DIAGNOSIS — R748 Abnormal levels of other serum enzymes: Secondary | ICD-10-CM

## 2024-01-21 ENCOUNTER — Other Ambulatory Visit

## 2024-01-21 ENCOUNTER — Telehealth: Payer: Self-pay

## 2024-01-21 DIAGNOSIS — R748 Abnormal levels of other serum enzymes: Secondary | ICD-10-CM

## 2024-01-21 DIAGNOSIS — E1165 Type 2 diabetes mellitus with hyperglycemia: Secondary | ICD-10-CM

## 2024-01-21 NOTE — Telephone Encounter (Signed)
 Rec'd refill request fax from [employer] for patients Farxiga  medication.   Please send a 90 day supply with refills to [facility] pharmacy if applicable. Thanks!

## 2024-01-22 LAB — CMP14+EGFR
ALT: 56 IU/L — ABNORMAL HIGH (ref 0–32)
AST: 40 IU/L (ref 0–40)
Albumin: 4 g/dL (ref 3.9–4.9)
Alkaline Phosphatase: 109 IU/L (ref 44–121)
BUN/Creatinine Ratio: 27 (ref 12–28)
BUN: 29 mg/dL — ABNORMAL HIGH (ref 8–27)
Bilirubin Total: 0.3 mg/dL (ref 0.0–1.2)
CO2: 18 mmol/L — ABNORMAL LOW (ref 20–29)
Calcium: 9.2 mg/dL (ref 8.7–10.3)
Chloride: 103 mmol/L (ref 96–106)
Creatinine, Ser: 1.09 mg/dL — ABNORMAL HIGH (ref 0.57–1.00)
Globulin, Total: 2.7 g/dL (ref 1.5–4.5)
Glucose: 133 mg/dL — ABNORMAL HIGH (ref 70–99)
Potassium: 4.8 mmol/L (ref 3.5–5.2)
Sodium: 138 mmol/L (ref 134–144)
Total Protein: 6.7 g/dL (ref 6.0–8.5)
eGFR: 56 mL/min/{1.73_m2} — ABNORMAL LOW (ref >59)

## 2024-01-22 MED ORDER — DAPAGLIFLOZIN PROPANEDIOL 10 MG PO TABS: 10.0000 mg | ORAL_TABLET | Freq: Every day | ORAL | 3 refills | Status: AC

## 2024-01-22 NOTE — Telephone Encounter (Signed)
 Refills sent to PCP for cosign

## 2024-01-23 ENCOUNTER — Other Ambulatory Visit

## 2024-01-23 ENCOUNTER — Ambulatory Visit: Payer: Self-pay | Admitting: Family Medicine

## 2024-01-23 DIAGNOSIS — R748 Abnormal levels of other serum enzymes: Secondary | ICD-10-CM

## 2024-02-04 ENCOUNTER — Other Ambulatory Visit

## 2024-02-04 DIAGNOSIS — R748 Abnormal levels of other serum enzymes: Secondary | ICD-10-CM

## 2024-02-05 LAB — CMP14+EGFR
ALT: 47 IU/L — AB (ref 0–32)
AST: 30 IU/L (ref 0–40)
Albumin: 3.9 g/dL (ref 3.9–4.9)
Alkaline Phosphatase: 95 IU/L (ref 44–121)
BUN/Creatinine Ratio: 28 (ref 12–28)
BUN: 32 mg/dL — AB (ref 8–27)
Bilirubin Total: 0.3 mg/dL (ref 0.0–1.2)
CO2: 17 mmol/L — AB (ref 20–29)
Calcium: 9.6 mg/dL (ref 8.7–10.3)
Chloride: 99 mmol/L (ref 96–106)
Creatinine, Ser: 1.16 mg/dL — AB (ref 0.57–1.00)
Globulin, Total: 2.8 g/dL (ref 1.5–4.5)
Glucose: 113 mg/dL — AB (ref 70–99)
Potassium: 4.6 mmol/L (ref 3.5–5.2)
Sodium: 136 mmol/L (ref 134–144)
Total Protein: 6.7 g/dL (ref 6.0–8.5)
eGFR: 52 mL/min/{1.73_m2} — AB (ref >59)

## 2024-02-05 LAB — CBC WITH DIFFERENTIAL/PLATELET
Basophils Absolute: 0.1 10*3/uL (ref 0.0–0.2)
Basos: 1 %
EOS (ABSOLUTE): 0.2 10*3/uL (ref 0.0–0.4)
Eos: 3 %
Hematocrit: 43.8 % (ref 34.0–46.6)
Hemoglobin: 14 g/dL (ref 11.1–15.9)
Immature Grans (Abs): 0 10*3/uL (ref 0.0–0.1)
Immature Granulocytes: 0 %
Lymphocytes Absolute: 1.3 10*3/uL (ref 0.7–3.1)
Lymphs: 19 %
MCH: 29.2 pg (ref 26.6–33.0)
MCHC: 32 g/dL (ref 31.5–35.7)
MCV: 91 fL (ref 79–97)
Monocytes Absolute: 0.6 10*3/uL (ref 0.1–0.9)
Monocytes: 9 %
Neutrophils Absolute: 4.4 10*3/uL (ref 1.4–7.0)
Neutrophils: 67 %
Platelets: 315 10*3/uL (ref 150–450)
RBC: 4.8 x10E6/uL (ref 3.77–5.28)
RDW: 12.4 % (ref 11.7–15.4)
WBC: 6.6 10*3/uL (ref 3.4–10.8)

## 2024-02-06 ENCOUNTER — Ambulatory Visit: Payer: Self-pay | Admitting: Family Medicine

## 2024-02-06 DIAGNOSIS — R748 Abnormal levels of other serum enzymes: Secondary | ICD-10-CM

## 2024-02-07 ENCOUNTER — Other Ambulatory Visit: Payer: Self-pay | Admitting: Family Medicine

## 2024-02-07 DIAGNOSIS — I152 Hypertension secondary to endocrine disorders: Secondary | ICD-10-CM

## 2024-02-07 DIAGNOSIS — E1165 Type 2 diabetes mellitus with hyperglycemia: Secondary | ICD-10-CM

## 2024-02-07 DIAGNOSIS — E1169 Type 2 diabetes mellitus with other specified complication: Secondary | ICD-10-CM

## 2024-03-04 ENCOUNTER — Telehealth: Payer: Self-pay

## 2024-03-04 NOTE — Telephone Encounter (Signed)
 [facility] refill form for OZEMPIC  2MG  PENS emailed to [facility] for completion.

## 2024-03-05 ENCOUNTER — Other Ambulatory Visit

## 2024-03-05 DIAGNOSIS — R748 Abnormal levels of other serum enzymes: Secondary | ICD-10-CM | POA: Diagnosis not present

## 2024-03-06 ENCOUNTER — Ambulatory Visit: Payer: Self-pay | Admitting: Family Medicine

## 2024-03-06 DIAGNOSIS — R748 Abnormal levels of other serum enzymes: Secondary | ICD-10-CM

## 2024-03-06 LAB — CMP14+EGFR
ALT: 43 IU/L — ABNORMAL HIGH (ref 0–32)
AST: 29 IU/L (ref 0–40)
Albumin: 4.2 g/dL (ref 3.9–4.9)
Alkaline Phosphatase: 95 IU/L (ref 44–121)
BUN/Creatinine Ratio: 19 (ref 12–28)
BUN: 20 mg/dL (ref 8–27)
Bilirubin Total: 0.3 mg/dL (ref 0.0–1.2)
CO2: 19 mmol/L — ABNORMAL LOW (ref 20–29)
Calcium: 9.9 mg/dL (ref 8.7–10.3)
Chloride: 100 mmol/L (ref 96–106)
Creatinine, Ser: 1.08 mg/dL — ABNORMAL HIGH (ref 0.57–1.00)
Globulin, Total: 2.9 g/dL (ref 1.5–4.5)
Glucose: 112 mg/dL — ABNORMAL HIGH (ref 70–99)
Potassium: 4.3 mmol/L (ref 3.5–5.2)
Sodium: 135 mmol/L (ref 134–144)
Total Protein: 7.1 g/dL (ref 6.0–8.5)
eGFR: 56 mL/min/1.73 — ABNORMAL LOW (ref >59)

## 2024-03-10 NOTE — Telephone Encounter (Signed)
Faxed refills to [facility].

## 2024-03-13 ENCOUNTER — Ambulatory Visit: Payer: Medicare HMO

## 2024-03-13 VITALS — BP 120/68 | HR 88 | Ht 63.0 in | Wt 130.0 lb

## 2024-03-13 DIAGNOSIS — Z0000 Encounter for general adult medical examination without abnormal findings: Secondary | ICD-10-CM | POA: Diagnosis not present

## 2024-03-13 NOTE — Patient Instructions (Addendum)
 Kelsey Porter , Thank you for taking time out of your busy schedule to complete your Annual Wellness Visit with me. I enjoyed our conversation and look forward to speaking with you again next year. I, as well as your care team,  appreciate your ongoing commitment to your health goals. Please review the following plan we discussed and let me know if I can assist you in the future. Your Game plan/ To Do List    Referrals: If you haven't heard from the office you've been referred to, please reach out to them at the phone provided.   Follow up Visits: We will see or speak with you next year for your Next Medicare AWV with our clinical staff on 03/16/25 at 1:10p.m. Have you seen your provider in the last 6 months (3 months if uncontrolled diabetes)? Yes  Clinician Recommendations:  Aim for 30 minutes of exercise or brisk walking, 6-8 glasses of water , and 5 servings of fruits and vegetables each day.       This is a list of the screenings recommended for you:  Health Maintenance  Topic Date Due   Zoster (Shingles) Vaccine (2 of 2) 02/18/2024   Yearly kidney health urinalysis for diabetes  03/08/2024   Medicare Annual Wellness Visit  03/12/2024   Flu Shot  03/07/2024   Hepatitis C Screening  11/06/2024*   COVID-19 Vaccine (4 - 2024-25 season) 11/22/2024*   Hemoglobin A1C  06/25/2024   Mammogram  07/15/2024   Complete foot exam   09/09/2024   Eye exam for diabetics  10/16/2024   Yearly kidney function blood test for diabetes  03/05/2025   Colon Cancer Screening  11/29/2025   DTaP/Tdap/Td vaccine (2 - Td or Tdap) 06/20/2027   Pneumococcal Vaccine for age over 9  Completed   DEXA scan (bone density measurement)  Completed   Hepatitis B Vaccine  Aged Out   HPV Vaccine  Aged Out   Meningitis B Vaccine  Aged Out   Screening for Lung Cancer  Discontinued  *Topic was postponed. The date shown is not the original due date.    Advanced directives: (Declined) Advance directive discussed with  you today. Even though you declined this today, please call our office should you change your mind, and we can give you the proper paperwork for you to fill out. Advance Care Planning is important because it:  [x]  Makes sure you receive the medical care that is consistent with your values, goals, and preferences  [x]  It provides guidance to your family and loved ones and reduces their decisional burden about whether or not they are making the right decisions based on your wishes.  Follow the link provided in your after visit summary or read over the paperwork we have mailed to you to help you started getting your Advance Directives in place. If you need assistance in completing these, please reach out to us  so that we can help you!  See attachments for Preventive Care and Fall Prevention Tips.

## 2024-03-13 NOTE — Progress Notes (Signed)
 Subjective:   Kelsey Porter is a 68 y.o. who presents for a Medicare Wellness preventive visit.  As a reminder, Annual Wellness Visits don't include a physical exam, and some assessments may be limited, especially if this visit is performed virtually. We may recommend an in-person follow-up visit with your provider if needed.  Visit Complete: Virtual I connected with  Kelsey Porter on 03/13/24 by a audio enabled telemedicine application and verified that I am speaking with the correct person using two identifiers.  Patient Location: Home  Provider Location: Home Office  I discussed the limitations of evaluation and management by telemedicine. The patient expressed understanding and agreed to proceed.  Vital Signs: Because this visit was a virtual/telehealth visit, some criteria may be missing or patient reported. Any vitals not documented were not able to be obtained and vitals that have been documented are patient reported.  VideoDeclined- This patient declined Librarian, academic. Therefore the visit was completed with audio only.  Persons Participating in Visit: Patient.  AWV Questionnaire: No: Patient Medicare AWV questionnaire was not completed prior to this visit.  Cardiac Risk Factors include: advanced age (>77men, >47 women)     Objective:    Today's Vitals   03/13/24 1309  BP: 120/68  Pulse: 88  Weight: 130 lb (59 kg)  Height: 5' 3 (1.6 m)   Body mass index is 23.03 kg/m.     03/13/2024    1:14 PM 03/13/2023    1:35 PM 03/08/2022   10:46 AM 05/19/2021    9:12 AM 11/29/2020    8:43 AM  Advanced Directives  Does Patient Have a Medical Advance Directive? No No No No No  Would patient like information on creating a medical advance directive?  Yes (MAU/Ambulatory/Procedural Areas - Information given) Yes (MAU/Ambulatory/Procedural Areas - Information given) No - Patient declined Yes (MAU/Ambulatory/Procedural Areas - Information  given)    Current Medications (verified) Outpatient Encounter Medications as of 03/13/2024  Medication Sig   albuterol  (VENTOLIN  HFA) 108 (90 Base) MCG/ACT inhaler Inhale 2 puffs into the lungs every 4 (four) hours as needed for wheezing or shortness of breath.   amLODipine  (NORVASC ) 5 MG tablet Take 1 tablet (5 mg total) by mouth daily.   atorvastatin  (LIPITOR) 20 MG tablet TAKE 1 TABLET EVERY DAY   CALCIUM  PO Take by mouth.   dapagliflozin  propanediol (FARXIGA ) 10 MG TABS tablet Take 1 tablet (10 mg total) by mouth daily.   EPINEPHrine  0.3 mg/0.3 mL IJ SOAJ injection Inject 0.3 mg into the muscle as needed for anaphylaxis.   FLUoxetine  (PROZAC ) 40 MG capsule Take 2 capsules (80 mg total) by mouth daily.   fluticasone  (FLONASE ) 50 MCG/ACT nasal spray USE 2 SPRAYS IN EACH NOSTRIL EVERY DAY   glipiZIDE  (GLUCOTROL ) 5 MG tablet Take 1 tablet (5 mg total) by mouth 2 (two) times daily before a meal.   lisinopril  (ZESTRIL ) 10 MG tablet TAKE 1 TABLET EVERY DAY   Semaglutide , 2 MG/DOSE, (OZEMPIC , 2 MG/DOSE,) 8 MG/3ML SOPN Inject 2 mg into the skin once a week.   triamcinolone  cream (KENALOG ) 0.1 % Apply 1 Application topically 2 (two) times daily.   Turmeric 500 MG CAPS Take 500 mg by mouth once.   No facility-administered encounter medications on file as of 03/13/2024.    Allergies (verified) Bee venom and Penicillin g   History: Past Medical History:  Diagnosis Date   Allergy    Arthritis    COPD (chronic obstructive pulmonary disease) (HCC)  Depression    Diabetes mellitus without complication (HCC)    History of Bell's palsy    Hyperlipidemia    Hypertension    Past Surgical History:  Procedure Laterality Date   BACK SURGERY  2002   COLON SURGERY     had part of her colon removed   COLONOSCOPY WITH PROPOFOL  N/A 11/29/2020   Procedure: COLONOSCOPY WITH PROPOFOL ;  Surgeon: Cindie Carlin POUR, DO;  [facility]: [facility];  Service: Endoscopy;  Laterality: N/A;  AM-diabetic    POLYPECTOMY  11/29/2020   Procedure: POLYPECTOMY;  Surgeon: Cindie Carlin POUR, DO;  Location: [facility];  Service: Endoscopy;;   Family History  Problem Relation Age of Onset   Alcohol abuse Mother    Arthritis Mother    COPD Mother    Diabetes Mother    Heart disease Mother    Hyperlipidemia Mother    Hypertension Mother    Kidney disease Mother    Cancer Father        lung   Diabetes Sister    Arthritis Maternal Grandmother    Diabetes Maternal Grandmother    Stroke Maternal Grandmother    Anxiety disorder Brother    Diabetes Brother    Colon cancer Neg Hx    Breast cancer Neg Hx    Social History   Socioeconomic History   Marital status: Married    Spouse name: Ozell   Number of children: 2   Years of education: 14   Highest education level: Associate degree: occupational, Scientist, product/process development, or vocational program  Occupational History   Occupation: Retired  Tobacco Use   Smoking status: Former    Average packs/day: 1 pack/day for 40.0 years (40.0 ttl pk-yrs)    Types: Cigarettes    Start date: 08/13/2023    Quit date: 1990    Years since quitting: 35.6   Smokeless tobacco: Never  Vaping Use   Vaping status: Never Used  Substance and Sexual Activity   Alcohol use: Never   Drug use: Never   Sexual activity: Yes    Birth control/protection: Post-menopausal  Other Topics Concern   Not on file  Social History Narrative   Not on file   Social Drivers of Health   Financial Resource Strain: Low Risk  (03/13/2023)   Overall Financial Resource Strain (CARDIA)    Difficulty of Paying Living Expenses: Not hard at all  Food Insecurity: Food Insecurity Present (03/13/2024)   Hunger Vital Sign    Worried About Running Out of Food in the Last Year: Often true    Ran Out of Food in the Last Year: Sometimes true  Transportation Needs: No Transportation Needs (03/13/2024)   PRAPARE - Administrator, Civil Service (Medical): No    Lack of Transportation (Non-Medical):  No  Physical Activity: Sufficiently Active (03/13/2024)   Exercise Vital Sign    Days of Exercise per Week: 7 days    Minutes of Exercise per Session: 30 min  Stress: Stress Concern Present (03/13/2024)   [employer] of Occupational Health - Occupational Stress Questionnaire    Feeling of Stress: To some extent  Social Connections: Moderately Isolated (03/13/2024)   Social Connection and Isolation Panel    Frequency of Communication with Friends and Family: More than three times a week    Frequency of Social Gatherings with Friends and Family: More than three times a week    Attends Religious Services: Never    Database administrator or Organizations: No  Attends Banker Meetings: Never    Marital Status: Married    Tobacco Counseling Counseling given: Yes    Clinical Intake:  Pre-visit preparation completed: Yes  Pain : No/denies pain     BMI - recorded: 23.03 Nutritional Status: BMI of 19-24  Normal Nutritional Risks: None Diabetes: Yes CBG done?: No  Lab Results  Component Value Date   HGBA1C 5.9 (H) 12/24/2023   HGBA1C 7.5 (H) 09/10/2023   HGBA1C 7.0 (H) 03/09/2023     How often do you need to have someone help you when you read instructions, pamphlets, or other written materials from your doctor or pharmacy?: 1 - Never  Interpreter Needed?: No  Information entered by :: alia t/cma   Activities of Daily Living     03/13/2024    1:14 PM  In your present state of health, do you have any difficulty performing the following activities:  Hearing? 0  Vision? 0  Difficulty concentrating or making decisions? 0  Walking or climbing stairs? 0  Dressing or bathing? 0  Doing errands, shopping? 0  Preparing Food and eating ? N  Using the Toilet? N  In the past six months, have you accidently leaked urine? N  Do you have problems with loss of bowel control? N  Managing your Medications? N  Managing your Finances? N  Housekeeping or managing your  Housekeeping? N    Patient Care Team: Joesph Annabella HERO, FNP as PCP - General (Family Medicine) Billee Mliss BIRCH, [facility] as Pharmacist (Family Medicine) Cindie Carlin POUR, DO as Consulting Physician (Internal Medicine)  I have updated your Care Teams any recent Medical Services you may have received from other providers in the past year.     Assessment:   This is a routine wellness examination for Kelsey Porter.  Hearing/Vision screen Hearing Screening - Comments:: Pt denies hearing dif Vision Screening - Comments:: Pt denies vision dif only reading glasses/last ov 1/25   Goals Addressed             This Visit's Progress    Exercise 150 min/wk Moderate Activity   On track      Depression Screen     03/13/2024    1:24 PM 12/24/2023   10:45 AM 11/07/2023    9:48 AM 09/10/2023    9:48 AM 08/13/2023    2:54 PM 03/13/2023    1:34 PM 03/09/2023    8:05 AM  PHQ 2/9 Scores  PHQ - 2 Score 0     0 0  PHQ- 9 Score      0 0  Exception Documentation  Patient refusal Patient refusal Patient refusal Patient refusal      Fall Risk     03/13/2024    1:12 PM 12/24/2023   10:45 AM 11/07/2023    9:48 AM 08/13/2023    2:54 PM 03/13/2023    1:33 PM  Fall Risk   Falls in the past year? 0 0 0 0 0  Number falls in past yr: 0    0  Injury with Fall? 0    0  Risk for fall due to : No Fall Risks    No Fall Risks  Follow up Falls evaluation completed    Falls prevention discussed    MEDICARE RISK AT HOME:  Medicare Risk at Home Any stairs in or around the home?: Yes If so, are there any without handrails?: Yes Home free of loose throw rugs in walkways, pet beds, electrical cords, etc?: Yes  Adequate lighting in your home to reduce risk of falls?: Yes Life alert?: No Use of a cane, walker or w/c?: No Grab bars in the bathroom?: Yes Shower chair or bench in shower?: No Elevated toilet seat or a handicapped toilet?: No  TIMED UP AND GO:  Was the test performed?  no  Cognitive Function: 6CIT completed     03/08/2022   10:41 AM  MMSE - Mini Mental State Exam  Orientation to time 5  Orientation to Place 5  Registration 3  Attention/ Calculation 5  Recall 1  Language- name 2 objects 2  Language- repeat 1  Language- follow 3 step command 3  Language- read & follow direction 1  Write a sentence 1  Copy design 1  Total score 28        03/13/2024    1:15 PM 03/13/2023    1:35 PM  6CIT Screen  What Year? 0 points 0 points  What month? 0 points 0 points  What time? 0 points 0 points  Count back from 20 0 points 0 points  Months in reverse 0 points 0 points  Repeat phrase 0 points 0 points  Total Score 0 points 0 points    Immunizations Immunization History  Administered Date(s) Administered   Fluad Quad(high Dose 65+) 09/08/2022   Influenza Split 05/03/2015   Influenza,inj,Quad PF,6+ Mos 05/05/2021   Influenza-Unspecified 07/05/2020   Moderna Sars-Covid-2 Vaccination 11/30/2019, 12/27/2019, 07/19/2020   PNEUMOCOCCAL CONJUGATE-20 03/08/2022   Pneumococcal Polysaccharide-23 02/28/2015   Tdap 06/19/2017   Zoster Recombinant(Shingrix ) 12/24/2023    Screening Tests Health Maintenance  Topic Date Due   Zoster Vaccines- Shingrix  (2 of 2) 02/18/2024   Diabetic kidney evaluation - Urine ACR  03/08/2024   INFLUENZA VACCINE  03/07/2024   Hepatitis C Screening  11/06/2024 (Originally 08/01/1974)   COVID-19 Vaccine (4 - 2024-25 season) 11/22/2024 (Originally 04/08/2023)   HEMOGLOBIN A1C  06/25/2024   MAMMOGRAM  07/15/2024   FOOT EXAM  09/09/2024   OPHTHALMOLOGY EXAM  10/16/2024   Diabetic kidney evaluation - eGFR measurement  03/05/2025   Medicare Annual Wellness (AWV)  03/13/2025   Colonoscopy  11/29/2025   DTaP/Tdap/Td (2 - Td or Tdap) 06/20/2027   Pneumococcal Vaccine: 50+ Years  Completed   DEXA SCAN  Completed   Hepatitis B Vaccines  Aged Out   HPV VACCINES  Aged Out   Meningococcal B Vaccine  Aged Out   Lung Cancer Screening  Discontinued    Health Maintenance  Health  Maintenance Due  Topic Date Due   Zoster Vaccines- Shingrix  (2 of 2) 02/18/2024   Diabetic kidney evaluation - Urine ACR  03/08/2024   INFLUENZA VACCINE  03/07/2024   Health Maintenance Items Addressed: See Nurse Notes at the end of this note  Additional Screening:  Vision Screening: Recommended annual ophthalmology exams for early detection of glaucoma and other disorders of the eye. Would you like a referral to an eye doctor? No    Dental Screening: Recommended annual dental exams for proper oral hygiene  Community Resource Referral / Chronic Care Management: CRR required this visit?  No   CCM required this visit?  No   Plan:    I have personally reviewed and noted the following in the patient's chart:   Medical and social history Use of alcohol, tobacco or illicit drugs  Current medications and supplements including opioid prescriptions. Patient is not currently taking opioid prescriptions. Functional ability and status Nutritional status Physical activity Advanced directives List of other  physicians Hospitalizations, surgeries, and ER visits in previous 12 months Vitals Screenings to include cognitive, depression, and falls Referrals and appointments  In addition, I have reviewed and discussed with patient certain preventive protocols, quality metrics, and best practice recommendations. A written personalized care plan for preventive services as well as general preventive health recommendations were provided to patient.   Ozie Ned, CMA   03/13/2024   After Visit Summary: (Declined) Due to this being a telephonic visit, with patients personalized plan was offered to patient but patient Declined AVS at this time   Notes: Nothing significant to report at this time.

## 2024-03-27 ENCOUNTER — Ambulatory Visit: Admitting: Family Medicine

## 2024-03-31 ENCOUNTER — Encounter: Payer: Self-pay | Admitting: Family Medicine

## 2024-03-31 ENCOUNTER — Ambulatory Visit (INDEPENDENT_AMBULATORY_CARE_PROVIDER_SITE_OTHER): Admitting: Family Medicine

## 2024-03-31 VITALS — BP 111/69 | HR 94 | Temp 98.2°F | Ht 63.0 in | Wt 129.8 lb

## 2024-03-31 DIAGNOSIS — J439 Emphysema, unspecified: Secondary | ICD-10-CM | POA: Diagnosis not present

## 2024-03-31 DIAGNOSIS — Z0000 Encounter for general adult medical examination without abnormal findings: Secondary | ICD-10-CM

## 2024-03-31 DIAGNOSIS — E1165 Type 2 diabetes mellitus with hyperglycemia: Secondary | ICD-10-CM | POA: Diagnosis not present

## 2024-03-31 DIAGNOSIS — Z0001 Encounter for general adult medical examination with abnormal findings: Secondary | ICD-10-CM

## 2024-03-31 DIAGNOSIS — E1169 Type 2 diabetes mellitus with other specified complication: Secondary | ICD-10-CM

## 2024-03-31 DIAGNOSIS — F419 Anxiety disorder, unspecified: Secondary | ICD-10-CM | POA: Diagnosis not present

## 2024-03-31 DIAGNOSIS — I700 Atherosclerosis of aorta: Secondary | ICD-10-CM | POA: Diagnosis not present

## 2024-03-31 DIAGNOSIS — Z23 Encounter for immunization: Secondary | ICD-10-CM

## 2024-03-31 DIAGNOSIS — F339 Major depressive disorder, recurrent, unspecified: Secondary | ICD-10-CM | POA: Diagnosis not present

## 2024-03-31 DIAGNOSIS — N1831 Chronic kidney disease, stage 3a: Secondary | ICD-10-CM

## 2024-03-31 DIAGNOSIS — E1159 Type 2 diabetes mellitus with other circulatory complications: Secondary | ICD-10-CM

## 2024-03-31 DIAGNOSIS — E785 Hyperlipidemia, unspecified: Secondary | ICD-10-CM | POA: Diagnosis not present

## 2024-03-31 DIAGNOSIS — I152 Hypertension secondary to endocrine disorders: Secondary | ICD-10-CM

## 2024-03-31 DIAGNOSIS — F17201 Nicotine dependence, unspecified, in remission: Secondary | ICD-10-CM

## 2024-03-31 LAB — BAYER DCA HB A1C WAIVED: HB A1C (BAYER DCA - WAIVED): 6 % — ABNORMAL HIGH (ref 4.8–5.6)

## 2024-03-31 MED ORDER — ALBUTEROL SULFATE HFA 108 (90 BASE) MCG/ACT IN AERS: 2.0000 | INHALATION_SPRAY | RESPIRATORY_TRACT | 2 refills | Status: DC | PRN

## 2024-03-31 MED ORDER — FLUTICASONE-SALMETEROL 115-21 MCG/ACT IN AERO: 2.0000 | INHALATION_SPRAY | Freq: Two times a day (BID) | RESPIRATORY_TRACT | 12 refills | Status: DC

## 2024-03-31 MED ORDER — LISINOPRIL 10 MG PO TABS: 10.0000 mg | ORAL_TABLET | Freq: Every day | ORAL | 3 refills | Status: AC

## 2024-03-31 MED ORDER — ATORVASTATIN CALCIUM 20 MG PO TABS
20.0000 mg | ORAL_TABLET | Freq: Every day | ORAL | 3 refills | Status: AC
Start: 2024-03-31 — End: ?

## 2024-03-31 NOTE — Patient Instructions (Signed)

## 2024-03-31 NOTE — Progress Notes (Signed)
 Complete physical exam  Patient: Kelsey Porter   DOB: 29-Oct-1955   68 y.o. Female  MRN: [id number]  Subjective:    Chief Complaint  Patient presents with   Medical Management of Chronic Issues   Annual Exam    Kelsey Porter is a 68 y.o. female who presents today for a complete physical exam. She reports consuming a well balanced diet. Home exercise routine includes walking, gardening. She generally feels well. She reports sleeping fairly well. She does not have additional problems to discuss today.   Glycemic control and antidiabetic medication effects - Hemoglobin A1c improved to 6.0 from 7.5 - Blood glucose levels generally range from 130 to 140 mg/dL - Currently takes Ozempic  and Farxiga  - Glipizide  discontinued due to hypoglycemia - Experiences hunger within an hour of medication intake  Tobacco use and relapse risk - Smoked a couple of cigarettes a month ago - Expresses concern about risk of returning to regular smoking, comparing it to alcohol addiction  Dyspnea and bronchodilator use - Recently experienced significant shortness of breath during a trip to the beach, attributed to high humidity - Shortness of breath relieved by albuterol  inhaler use - Not on maintenance inhaler    Anxiety and depression has been well controlled with medication.    Most recent fall risk assessment:    03/31/2024   10:37 AM  Fall Risk   Falls in the past year? 0     Most recent depression screenings:    03/31/2024   10:37 AM 03/13/2024    1:24 PM  PHQ 2/9 Scores  PHQ - 2 Score  0  Exception Documentation Patient refusal         Patient Care Team: Joesph Annabella HERO, FNP as PCP - General (Family Medicine) Billee Mliss BIRCH, [facility] as Pharmacist (Family Medicine) Cindie Carlin POUR, DO as Consulting Physician (Internal Medicine)   Outpatient Medications Prior to Visit  Medication Sig   albuterol  (VENTOLIN  HFA) 108 (90 Base) MCG/ACT inhaler Inhale 2 puffs into the  lungs every 4 (four) hours as needed for wheezing or shortness of breath.   amLODipine  (NORVASC ) 5 MG tablet Take 1 tablet (5 mg total) by mouth daily.   atorvastatin  (LIPITOR) 20 MG tablet TAKE 1 TABLET EVERY DAY   CALCIUM  PO Take by mouth.   dapagliflozin  propanediol (FARXIGA ) 10 MG TABS tablet Take 1 tablet (10 mg total) by mouth daily.   EPINEPHrine  0.3 mg/0.3 mL IJ SOAJ injection Inject 0.3 mg into the muscle as needed for anaphylaxis.   FLUoxetine  (PROZAC ) 40 MG capsule Take 2 capsules (80 mg total) by mouth daily.   fluticasone  (FLONASE ) 50 MCG/ACT nasal spray USE 2 SPRAYS IN EACH NOSTRIL EVERY DAY   lisinopril  (ZESTRIL ) 10 MG tablet TAKE 1 TABLET EVERY DAY   Semaglutide , 2 MG/DOSE, (OZEMPIC , 2 MG/DOSE,) 8 MG/3ML SOPN Inject 2 mg into the skin once a week.   triamcinolone  cream (KENALOG ) 0.1 % Apply 1 Application topically 2 (two) times daily.   [DISCONTINUED] glipiZIDE  (GLUCOTROL ) 5 MG tablet Take 1 tablet (5 mg total) by mouth 2 (two) times daily before a meal.   [DISCONTINUED] Turmeric 500 MG CAPS Take 500 mg by mouth once.   No facility-administered medications prior to visit.    ROS Negative unless specially indicated above in HPI.       Objective:     BP 111/69   Pulse 94   Temp 98.2 F (36.8 C) (Temporal)   Ht 5' 3 (1.6 m)  Wt 129 lb 12.8 oz (58.9 kg)   SpO2 94%   BMI 22.99 kg/m  Wt Readings from Last 3 Encounters:  03/31/24 129 lb 12.8 oz (58.9 kg)  03/13/24 130 lb (59 kg)  12/24/23 130 lb 12.8 oz (59.3 kg)      Physical Exam Vitals and nursing note reviewed.  Constitutional:      General: She is not in acute distress.    Appearance: Normal appearance. She is not ill-appearing, toxic-appearing or diaphoretic.  HENT:     Head: Normocephalic.     Right Ear: Tympanic membrane, ear canal and external ear normal.     Left Ear: Tympanic membrane, ear canal and external ear normal.     Nose: Nose normal.     Mouth/Throat:     Mouth: Mucous membranes are  moist.     Pharynx: Oropharynx is clear.  Eyes:     Extraocular Movements: Extraocular movements intact.     Conjunctiva/sclera: Conjunctivae normal.     Pupils: Pupils are equal, round, and reactive to light.  Cardiovascular:     Rate and Rhythm: Normal rate and regular rhythm.     Pulses: Normal pulses.     Heart sounds: Normal heart sounds. No murmur heard.    No friction rub. No gallop.  Pulmonary:     Effort: Pulmonary effort is normal.     Breath sounds: Normal breath sounds.  Abdominal:     General: Bowel sounds are normal. There is no distension.     Palpations: Abdomen is soft. There is no mass.     Tenderness: There is no abdominal tenderness. There is no guarding.  Musculoskeletal:     Cervical back: Normal range of motion and neck supple. No tenderness.     Right lower leg: No edema.     Left lower leg: No edema.  Skin:    General: Skin is warm and dry.     Capillary Refill: Capillary refill takes less than 2 seconds.     Findings: No lesion or rash.  Neurological:     General: No focal deficit present.     Mental Status: She is alert and oriented to person, place, and time.     Cranial Nerves: No cranial nerve deficit.     Motor: No weakness.     Gait: Gait normal.  Psychiatric:        Mood and Affect: Mood normal.        Behavior: Behavior normal.        Thought Content: Thought content normal.        Judgment: Judgment normal.      No results found for any visits on 03/31/24.     Assessment & Plan:    Routine Health Maintenance and Physical Exam  Kelsey Porter was seen today for medical management of chronic issues and annual exam.  Diagnoses and all orders for this visit:  Routine general medical examination at a health care facility  Type 2 diabetes mellitus with hyperglycemia, without long-term current use of insulin (HCC) Well controlled on current regimen. A1c at goal. On ACE and statin.  -     Bayer DCA Hb A1c Waived -     Microalbumin / creatinine  urine ratio -     atorvastatin  (LIPITOR) 20 MG tablet; Take 1 tablet (20 mg total) by mouth daily. -     lisinopril  (ZESTRIL ) 10 MG tablet; Take 1 tablet (10 mg total) by mouth daily.  Hypertension associated with diabetes (  HCC) Well controlled on current regimen.  -     CBC with Differential/Platelet -     CMP14+EGFR -     Microalbumin / creatinine urine ratio -     TSH -     lisinopril  (ZESTRIL ) 10 MG tablet; Take 1 tablet (10 mg total) by mouth daily.  Hyperlipidemia associated with type 2 diabetes mellitus (HCC) On statin. Labs pending.  -     Lipid panel -     atorvastatin  (LIPITOR) 20 MG tablet; Take 1 tablet (20 mg total) by mouth daily.  Stage 3a chronic kidney disease (HCC) On farxiga , ACE.  -     CMP14+EGFR -     Microalbumin / creatinine urine ratio -     VITAMIN D  25 Hydroxy (Vit-D Deficiency, Fractures)  Pulmonary emphysema, unspecified emphysema type (HCC) Uncontrolled. Add advair as below. Continue albuterol  prn.  -     fluticasone -salmeterol (ADVAIR HFA) 115-21 MCG/ACT inhaler; Inhale 2 puffs into the lungs 2 (two) times daily. -     albuterol  (VENTOLIN  HFA) 108 (90 Base) MCG/ACT inhaler; Inhale 2 puffs into the lungs every 4 (four) hours as needed for wheezing or shortness of breath.  Aortic atherosclerosis (HCC)  Recurrent depression (HCC) Anxiety Well controlled on current regimen.   Tobacco use disorder, moderate, in early remission Smoking cessation instruction/counseling given:  commended patient for quitting and reviewed strategies for preventing relapses   Need for vaccination -     Varicella-zoster vaccine IM    Immunization History  Administered Date(s) Administered   Fluad Quad(high Dose 65+) 09/08/2022   Influenza Split 05/03/2015   Influenza,inj,Quad PF,6+ Mos 05/05/2021   Influenza-Unspecified 07/05/2020   Moderna Sars-Covid-2 Vaccination 11/30/2019, 12/27/2019, 07/19/2020   PNEUMOCOCCAL CONJUGATE-20 03/08/2022   Pneumococcal  Polysaccharide-23 02/28/2015   Tdap 06/19/2017   Zoster Recombinant(Shingrix ) 12/24/2023, 03/31/2024    Health Maintenance  Topic Date Due   Diabetic kidney evaluation - Urine ACR  03/08/2024   INFLUENZA VACCINE  11/04/2024 (Originally 03/07/2024)   Hepatitis C Screening  11/06/2024 (Originally 08/01/1974)   COVID-19 Vaccine (4 - 2024-25 season) 11/22/2024 (Originally 04/08/2023)   MAMMOGRAM  07/15/2024   FOOT EXAM  09/09/2024   HEMOGLOBIN A1C  10/01/2024   OPHTHALMOLOGY EXAM  10/16/2024   Diabetic kidney evaluation - eGFR measurement  03/05/2025   Medicare Annual Wellness (AWV)  03/13/2025   Colonoscopy  11/29/2025   DTaP/Tdap/Td (2 - Td or Tdap) 06/20/2027   Pneumococcal Vaccine: 50+ Years  Completed   DEXA SCAN  Completed   Zoster Vaccines- Shingrix   Completed   HPV VACCINES  Aged Out   Meningococcal B Vaccine  Aged Out   Lung Cancer Screening  Discontinued    Discussed health benefits of physical activity, and encouraged her to engage in regular exercise appropriate for her age and condition.  Problem List Items Addressed This Visit       Cardiovascular and Mediastinum   Hypertension associated with diabetes (HCC)   Relevant Medications   atorvastatin  (LIPITOR) 20 MG tablet   lisinopril  (ZESTRIL ) 10 MG tablet   Other Relevant Orders   CBC with Differential/Platelet   CMP14+EGFR   Microalbumin / creatinine urine ratio   TSH   Aortic atherosclerosis (HCC)   Relevant Medications   atorvastatin  (LIPITOR) 20 MG tablet   lisinopril  (ZESTRIL ) 10 MG tablet     Respiratory   COPD with emphysema (HCC)   Relevant Medications   fluticasone -salmeterol (ADVAIR HFA) 115-21 MCG/ACT inhaler   albuterol  (VENTOLIN  HFA) 108 (90 Base)  MCG/ACT inhaler     Endocrine   Hyperlipidemia associated with type 2 diabetes mellitus (HCC)   Relevant Medications   atorvastatin  (LIPITOR) 20 MG tablet   lisinopril  (ZESTRIL ) 10 MG tablet   Other Relevant Orders   Lipid panel   Type 2 diabetes  mellitus with hyperglycemia, without long-term current use of insulin (HCC)   Relevant Medications   atorvastatin  (LIPITOR) 20 MG tablet   lisinopril  (ZESTRIL ) 10 MG tablet   Other Relevant Orders   Bayer DCA Hb A1c Waived (Completed)   Microalbumin / creatinine urine ratio     Genitourinary   Stage 3a chronic kidney disease (HCC)   Relevant Orders   CMP14+EGFR   Microalbumin / creatinine urine ratio   VITAMIN D  25 Hydroxy (Vit-D Deficiency, Fractures)     Other   Anxiety   Recurrent depression (HCC)   Tobacco use disorder, moderate, in early remission   Other Visit Diagnoses       Routine general medical examination at a health care facility    -  Primary     Need for vaccination       Relevant Orders   Varicella-zoster vaccine IM (Completed)      No follow-ups on file.     Annabella CHRISTELLA Search, FNP

## 2024-04-01 LAB — CMP14+EGFR
ALT: 35 IU/L — ABNORMAL HIGH (ref 0–32)
AST: 25 IU/L (ref 0–40)
Albumin: 3.7 g/dL — ABNORMAL LOW (ref 3.9–4.9)
Alkaline Phosphatase: 90 IU/L (ref 44–121)
BUN/Creatinine Ratio: 17 (ref 12–28)
BUN: 19 mg/dL (ref 8–27)
Bilirubin Total: 0.2 mg/dL (ref 0.0–1.2)
CO2: 23 mmol/L (ref 20–29)
Calcium: 9.3 mg/dL (ref 8.7–10.3)
Chloride: 98 mmol/L (ref 96–106)
Creatinine, Ser: 1.13 mg/dL — ABNORMAL HIGH (ref 0.57–1.00)
Globulin, Total: 2.6 g/dL (ref 1.5–4.5)
Glucose: 231 mg/dL — ABNORMAL HIGH (ref 70–99)
Potassium: 4 mmol/L (ref 3.5–5.2)
Sodium: 136 mmol/L (ref 134–144)
Total Protein: 6.3 g/dL (ref 6.0–8.5)
eGFR: 53 mL/min/1.73 — ABNORMAL LOW (ref >59)

## 2024-04-01 LAB — CBC WITH DIFFERENTIAL/PLATELET
Basophils Absolute: 0.1 x10E3/uL (ref 0.0–0.2)
Basos: 1 %
EOS (ABSOLUTE): 0.1 x10E3/uL (ref 0.0–0.4)
Eos: 2 %
Hematocrit: 45.1 % (ref 34.0–46.6)
Hemoglobin: 14.5 g/dL (ref 11.1–15.9)
Immature Grans (Abs): 0 x10E3/uL (ref 0.0–0.1)
Immature Granulocytes: 0 %
Lymphocytes Absolute: 1.1 x10E3/uL (ref 0.7–3.1)
Lymphs: 18 %
MCH: 29.6 pg (ref 26.6–33.0)
MCHC: 32.2 g/dL (ref 31.5–35.7)
MCV: 92 fL (ref 79–97)
Monocytes Absolute: 0.6 x10E3/uL (ref 0.1–0.9)
Monocytes: 9 %
Neutrophils Absolute: 4.4 x10E3/uL (ref 1.4–7.0)
Neutrophils: 70 %
Platelets: 339 x10E3/uL (ref 150–450)
RBC: 4.9 x10E6/uL (ref 3.77–5.28)
RDW: 12.9 % (ref 11.7–15.4)
WBC: 6.3 x10E3/uL (ref 3.4–10.8)

## 2024-04-01 LAB — TSH: TSH: 1.41 u[IU]/mL (ref 0.450–4.500)

## 2024-04-01 LAB — LIPID PANEL
Chol/HDL Ratio: 2.7 ratio (ref 0.0–4.4)
Cholesterol, Total: 168 mg/dL (ref 100–199)
HDL: 62 mg/dL (ref >39)
LDL Chol Calc (NIH): 88 mg/dL (ref 0–99)
Triglycerides: 98 mg/dL (ref 0–149)
VLDL Cholesterol Cal: 18 mg/dL (ref 5–40)

## 2024-04-01 LAB — MICROALBUMIN / CREATININE URINE RATIO
Creatinine, Urine: 104.7 mg/dL
Microalb/Creat Ratio: 111 mg/g{creat} — ABNORMAL HIGH (ref 0–29)
Microalbumin, Urine: 116.6 ug/mL

## 2024-04-01 LAB — VITAMIN D 25 HYDROXY (VIT D DEFICIENCY, FRACTURES): Vit D, 25-Hydroxy: 38.5 ng/mL (ref 30.0–100.0)

## 2024-04-02 ENCOUNTER — Ambulatory Visit: Payer: Self-pay | Admitting: Family Medicine

## 2024-04-02 ENCOUNTER — Telehealth: Payer: Self-pay

## 2024-04-02 DIAGNOSIS — J439 Emphysema, unspecified: Secondary | ICD-10-CM

## 2024-04-02 MED ORDER — ALBUTEROL SULFATE HFA 108 (90 BASE) MCG/ACT IN AERS: 2.0000 | INHALATION_SPRAY | RESPIRATORY_TRACT | 2 refills | Status: AC | PRN

## 2024-04-02 MED ORDER — FLUTICASONE-SALMETEROL 115-21 MCG/ACT IN AERO: 2.0000 | INHALATION_SPRAY | Freq: Two times a day (BID) | RESPIRATORY_TRACT | 12 refills | Status: AC

## 2024-04-02 NOTE — Addendum Note (Signed)
 Addended by: Jenavee Laguardia G on: 04/02/2024 05:18 PM   Modules accepted: Orders

## 2024-04-02 NOTE — Telephone Encounter (Signed)
 Copied from CRM [id number]. Topic: Clinical - Prescription Issue >> Apr 02, 2024 10:06 AM Turkey B wrote: Reason for CRM: Patient called in states can't afford advair med at $300, would like alernative

## 2024-04-02 NOTE — Telephone Encounter (Signed)
 I spoke to Kelsey Porter and she wanted me to send the Advair and Albuterol  to [facility] to see if it would be cheaper before [facility] changes it so I did resend rx's to [facility] and Kelsey Porter will call me back to let me know if we need to change.

## 2024-04-22 ENCOUNTER — Ambulatory Visit: Payer: Self-pay | Admitting: Family

## 2024-04-22 ENCOUNTER — Ambulatory Visit (INDEPENDENT_AMBULATORY_CARE_PROVIDER_SITE_OTHER)

## 2024-04-22 ENCOUNTER — Encounter: Payer: Self-pay | Admitting: Family

## 2024-04-22 ENCOUNTER — Ambulatory Visit (INDEPENDENT_AMBULATORY_CARE_PROVIDER_SITE_OTHER): Admitting: Family

## 2024-04-22 VITALS — BP 115/69 | HR 100 | Temp 98.1°F | Ht 63.0 in | Wt 130.4 lb

## 2024-04-22 DIAGNOSIS — R1032 Left lower quadrant pain: Secondary | ICD-10-CM

## 2024-04-22 DIAGNOSIS — R109 Unspecified abdominal pain: Secondary | ICD-10-CM | POA: Diagnosis not present

## 2024-04-22 LAB — URINALYSIS, COMPLETE
Bilirubin, UA: NEGATIVE
Leukocytes,UA: NEGATIVE
Nitrite, UA: NEGATIVE
RBC, UA: NEGATIVE
Specific Gravity, UA: 1.015 (ref 1.005–1.030)
Urobilinogen, Ur: 0.2 mg/dL (ref 0.2–1.0)
pH, UA: 7 (ref 5.0–7.5)

## 2024-04-22 LAB — MICROSCOPIC EXAMINATION: WBC, UA: NONE SEEN /HPF (ref 0–5)

## 2024-04-22 NOTE — Addendum Note (Signed)
 Addended by: LAVELL LYE A on: 04/22/2024 02:41 PM   Modules accepted: Orders, Level of Service

## 2024-04-22 NOTE — Patient Instructions (Signed)
 Flank Pain, Adult  Flank pain is pain in your side. The flank is the area on your side between your upper belly (abdomen) and your spine. The pain may occur over a short time (acute), or it may be long-term or come back often (chronic). It may be mild or very bad. Pain in this area can be caused by many different things.  Follow these instructions at home:    Drink enough fluid to keep your pee (urine) pale yellow.  Rest as told by your doctor.  Take over-the-counter and prescription medicines only as told by your doctor.  Keep a journal to keep track of:  What has caused your flank pain.  What has made your flank pain feel better.  Keep all follow-up visits.  Contact a doctor if:  Medicine does not help your pain.  You have new symptoms.  Your pain gets worse.  Your symptoms last longer than 2-3 days.  You have trouble peeing.  You are peeing more often than normal.  Get help right away if:  You have trouble breathing.  You are short of breath.  Your belly hurts, or it is swollen or red.  You feel like you may vomit (nauseous).  You vomit.  You feel faint, or you faint.  You have blood in your pee.  You have flank pain and a fever.  These symptoms may be an emergency. Get help right away. Call your local emergency services (911 in the [place]  Do not wait to see if the symptoms will go away.  Do not drive yourself to the hospital.  Summary  Flank pain is pain in your side. The flank is the area of your side between your upper belly (abdomen) and your spine.  Flank pain may occur over a short time (acute), or it may be long-term or come back often (chronic). It may be mild or very bad.  Pain in this area can be caused by many different things.  Contact your doctor if your symptoms get worse or last longer than 2-3 days.  This information is not intended to replace advice given to you by your health care provider. Make sure you discuss any questions you have with your health care provider.  Document Revised:  10/04/2020 Document Reviewed: 10/04/2020  Elsevier Patient Education  2024 [employer].

## 2024-04-22 NOTE — Progress Notes (Addendum)
 Subjective:    Patient ID: Kelsey Porter, female    DOB: [birth year], 68 y.o.   MRN: [id number]  Chief Complaint  Patient presents with   Flank Pain   Pt presents to the office today with flank pain that started Thursday.  Flank Pain This is a new problem. The current episode started in the past 7 days. The problem occurs intermittently. The problem has been waxing and waning since onset. Pain location: left flankl pain. The quality of the pain is described as aching. Radiates to: lower left abodmen. The pain is at a severity of 6/10. The pain is mild. Exacerbated by: walking. Pertinent negatives include no dysuria or fever. (Urinary frequency, nausea, vomiting) She has tried bed rest (tylenol) for the symptoms. The treatment provided mild relief.      Review of Systems  Constitutional:  Negative for fever.  Genitourinary:  Positive for flank pain. Negative for dysuria.  All other systems reviewed and are negative.   Social History   Socioeconomic History   Marital status: Married    Spouse name: Ozell   Number of children: 2   Years of education: 14   Highest education level: Associate degree: occupational, Scientist, product/process development, or vocational program  Occupational History   Occupation: Retired  Tobacco Use   Smoking status: Former    Average packs/day: 1 pack/day for 40.0 years (40.0 ttl pk-yrs)    Types: Cigarettes    Start date: 08/13/2023    Quit date: 1990    Years since quitting: 35.7   Smokeless tobacco: Never  Vaping Use   Vaping status: Never Used  Substance and Sexual Activity   Alcohol use: Never   Drug use: Never   Sexual activity: Yes    Birth control/protection: Post-menopausal  Other Topics Concern   Not on file  Social History Narrative   Not on file   Social Drivers of Health   Financial Resource Strain: Low Risk  (03/13/2023)   Overall Financial Resource Strain (CARDIA)    Difficulty of Paying Living Expenses: Not hard at all  Food Insecurity: Food  Insecurity Present (03/13/2024)   Hunger Vital Sign    Worried About Running Out of Food in the Last Year: Often true    Ran Out of Food in the Last Year: Sometimes true  Transportation Needs: No Transportation Needs (03/13/2024)   PRAPARE - Administrator, Civil Service (Medical): No    Lack of Transportation (Non-Medical): No  Physical Activity: Sufficiently Active (03/13/2024)   Exercise Vital Sign    Days of Exercise per Week: 7 days    Minutes of Exercise per Session: 30 min  Stress: Stress Concern Present (03/13/2024)   [employer] of Occupational Health - Occupational Stress Questionnaire    Feeling of Stress: To some extent  Social Connections: Moderately Isolated (03/13/2024)   Social Connection and Isolation Panel    Frequency of Communication with Friends and Family: More than three times a week    Frequency of Social Gatherings with Friends and Family: More than three times a week    Attends Religious Services: Never    Database administrator or Organizations: No    Attends Banker Meetings: Never    Marital Status: Married   Family History  Problem Relation Age of Onset   Alcohol abuse Mother    Arthritis Mother    COPD Mother    Diabetes Mother    Heart disease Mother    Hyperlipidemia Mother  Hypertension Mother    Kidney disease Mother    Cancer Father        lung   Diabetes Sister    Arthritis Maternal Grandmother    Diabetes Maternal Grandmother    Stroke Maternal Grandmother    Anxiety disorder Brother    Diabetes Brother    Colon cancer Neg Hx    Breast cancer Neg Hx         Objective:   Physical Exam Vitals reviewed.  Constitutional:      General: She is not in acute distress.    Appearance: She is well-developed.  HENT:     Head: Normocephalic and atraumatic.  Eyes:     Pupils: Pupils are equal, round, and reactive to light.  Neck:     Thyroid: No thyromegaly.  Cardiovascular:     Rate and Rhythm: Normal rate and  regular rhythm.     Heart sounds: Normal heart sounds. No murmur heard. Pulmonary:     Effort: Pulmonary effort is normal. No respiratory distress.     Breath sounds: Normal breath sounds. No wheezing.  Abdominal:     General: Bowel sounds are normal. There is no distension.     Palpations: Abdomen is soft.     Tenderness: There is abdominal tenderness (mild left lower abdominal pain). There is no right CVA tenderness or left CVA tenderness.  Musculoskeletal:        General: No tenderness. Normal range of motion.     Cervical back: Normal range of motion and neck supple.  Skin:    General: Skin is warm and dry.  Neurological:     Mental Status: She is alert and oriented to person, place, and time.     Cranial Nerves: No cranial nerve deficit.     Deep Tendon Reflexes: Reflexes are normal and symmetric.  Psychiatric:        Behavior: Behavior normal.        Thought Content: Thought content normal.        Judgment: Judgment normal.       BP 115/69   Pulse 100   Temp 98.1 F (36.7 C)   Ht 5' 3 (1.6 m)   Wt 130 lb 6.4 oz (59.1 kg)   SpO2 99%   BMI 23.10 kg/m      Assessment & Plan:  Kelsey Porter comes in today with chief complaint of Flank Pain   Diagnosis and orders addressed:  1. Flank pain (Primary) - Urine Culture - Urinalysis, Complete - CBC with Differential/Platelet - CMP14+EGFR - DG Abd 1 View; Future  2. Left lower quadrant abdominal pain - CBC with Differential/Platelet - CMP14+EGFR - DG Abd 1 View; Future   Urine negative Labs and x-ray pending  Differential diagnosis include, pyelonephritis, kidney stone,  diverticulitis, and constipation. CBC pending. If elevated, may need to order CT scan.    KUB abnormal- US  ordered to rule out stone.    Approx 40 mins spent with patient, chart review, and charting Bari Learn, FNP

## 2024-04-23 LAB — CBC WITH DIFFERENTIAL/PLATELET
Basophils Absolute: 0.1 x10E3/uL (ref 0.0–0.2)
Basos: 1 %
EOS (ABSOLUTE): 0.2 x10E3/uL (ref 0.0–0.4)
Eos: 3 %
Hematocrit: 45.5 % (ref 34.0–46.6)
Hemoglobin: 14.9 g/dL (ref 11.1–15.9)
Immature Grans (Abs): 0 x10E3/uL (ref 0.0–0.1)
Immature Granulocytes: 0 %
Lymphocytes Absolute: 1.4 x10E3/uL (ref 0.7–3.1)
Lymphs: 22 %
MCH: 29.6 pg (ref 26.6–33.0)
MCHC: 32.7 g/dL (ref 31.5–35.7)
MCV: 90 fL (ref 79–97)
Monocytes Absolute: 0.6 x10E3/uL (ref 0.1–0.9)
Monocytes: 9 %
Neutrophils Absolute: 4.2 x10E3/uL (ref 1.4–7.0)
Neutrophils: 65 %
Platelets: 325 x10E3/uL (ref 150–450)
RBC: 5.04 x10E6/uL (ref 3.77–5.28)
RDW: 13.5 % (ref 11.7–15.4)
WBC: 6.4 x10E3/uL (ref 3.4–10.8)

## 2024-04-23 LAB — CMP14+EGFR
ALT: 35 IU/L — AB (ref 0–32)
AST: 28 IU/L (ref 0–40)
Albumin: 4 g/dL (ref 3.9–4.9)
Alkaline Phosphatase: 85 IU/L (ref 49–135)
BUN/Creatinine Ratio: 14 (ref 12–28)
BUN: 20 mg/dL (ref 8–27)
Bilirubin Total: 0.3 mg/dL (ref 0.0–1.2)
CO2: 24 mmol/L (ref 20–29)
Calcium: 9.7 mg/dL (ref 8.7–10.3)
Chloride: 100 mmol/L (ref 96–106)
Creatinine, Ser: 1.48 mg/dL — AB (ref 0.57–1.00)
Globulin, Total: 2.5 g/dL (ref 1.5–4.5)
Glucose: 120 mg/dL — AB (ref 70–99)
Potassium: 4.1 mmol/L (ref 3.5–5.2)
Sodium: 137 mmol/L (ref 134–144)
Total Protein: 6.5 g/dL (ref 6.0–8.5)
eGFR: 39 mL/min/1.73 — AB (ref >59)

## 2024-04-24 LAB — URINE CULTURE

## 2024-04-24 MED ORDER — CEPHALEXIN 500 MG PO CAPS: 500.0000 mg | ORAL_CAPSULE | Freq: Two times a day (BID) | ORAL | 0 refills | Status: AC

## 2024-04-28 ENCOUNTER — Ambulatory Visit (HOSPITAL_COMMUNITY)

## 2024-05-07 ENCOUNTER — Ambulatory Visit (HOSPITAL_COMMUNITY)
Admission: RE | Admit: 2024-05-07 | Discharge: 2024-05-07 | Disposition: A | Discharge status: Home / Self Care | Source: Ambulatory Visit | Attending: Family | Admitting: Family

## 2024-05-07 DIAGNOSIS — R1032 Left lower quadrant pain: Secondary | ICD-10-CM | POA: Diagnosis not present

## 2024-05-07 DIAGNOSIS — R9341 Abnormal radiologic findings on diagnostic imaging of renal pelvis, ureter, or bladder: Secondary | ICD-10-CM | POA: Diagnosis not present

## 2024-05-07 DIAGNOSIS — R10A0 Flank pain, unspecified side: Secondary | ICD-10-CM | POA: Diagnosis not present

## 2024-05-16 ENCOUNTER — Telehealth: Payer: Self-pay

## 2024-05-16 NOTE — Telephone Encounter (Signed)
  Conditionally approved for Farxiga  PAP  Will reach out to patient to see if she rec'd letter in mail. ______________________________________  Patient also currently enrolled with [employer] for Ozempic  until 08/06/24.   Medicare patients will no longer be eligible for Ozempic  in 2026.

## 2024-06-10 ENCOUNTER — Ambulatory Visit (HOSPITAL_COMMUNITY)
Admission: RE | Admit: 2024-06-10 | Discharge: 2024-06-10 | Disposition: A | Discharge status: Home / Self Care | Source: Ambulatory Visit | Attending: Acute Care | Admitting: Acute Care

## 2024-06-10 DIAGNOSIS — Z122 Encounter for screening for malignant neoplasm of respiratory organs: Secondary | ICD-10-CM | POA: Diagnosis not present

## 2024-06-10 DIAGNOSIS — Z87891 Personal history of nicotine dependence: Secondary | ICD-10-CM | POA: Diagnosis not present

## 2024-06-10 DIAGNOSIS — F17210 Nicotine dependence, cigarettes, uncomplicated: Secondary | ICD-10-CM | POA: Diagnosis not present

## 2024-06-16 ENCOUNTER — Other Ambulatory Visit: Payer: Self-pay | Admitting: Acute Care

## 2024-06-16 DIAGNOSIS — Z87891 Personal history of nicotine dependence: Secondary | ICD-10-CM

## 2024-06-16 DIAGNOSIS — Z122 Encounter for screening for malignant neoplasm of respiratory organs: Secondary | ICD-10-CM

## 2024-06-16 DIAGNOSIS — F17210 Nicotine dependence, cigarettes, uncomplicated: Secondary | ICD-10-CM

## 2024-06-27 ENCOUNTER — Other Ambulatory Visit: Payer: Self-pay | Admitting: Family Medicine

## 2024-06-27 DIAGNOSIS — F339 Major depressive disorder, recurrent, unspecified: Secondary | ICD-10-CM

## 2024-06-27 DIAGNOSIS — E1159 Type 2 diabetes mellitus with other circulatory complications: Secondary | ICD-10-CM

## 2024-06-27 DIAGNOSIS — F419 Anxiety disorder, unspecified: Secondary | ICD-10-CM

## 2024-07-02 ENCOUNTER — Other Ambulatory Visit: Payer: Self-pay | Admitting: Family Medicine

## 2024-07-02 DIAGNOSIS — E1165 Type 2 diabetes mellitus with hyperglycemia: Secondary | ICD-10-CM

## 2024-07-04 ENCOUNTER — Other Ambulatory Visit: Payer: Self-pay | Admitting: Family Medicine

## 2024-07-04 DIAGNOSIS — J302 Other seasonal allergic rhinitis: Secondary | ICD-10-CM

## 2024-10-01 ENCOUNTER — Ambulatory Visit: Payer: Self-pay | Admitting: Family Medicine

## 2025-03-16 ENCOUNTER — Ambulatory Visit: Payer: Self-pay
